# Patient Record
Sex: Male | Born: 1975 | Race: White | Hispanic: No | Marital: Single | Smoking: Never smoker
Health system: Southern US, Community
[De-identification: ages and names within clinical notes are randomized; demographics above are authoritative.]

## PROBLEM LIST (undated history)

## (undated) DIAGNOSIS — I10 Essential (primary) hypertension: Secondary | ICD-10-CM

## (undated) DIAGNOSIS — I456 Pre-excitation syndrome: Secondary | ICD-10-CM

## (undated) HISTORY — DX: Essential (primary) hypertension: I10

## (undated) HISTORY — DX: Pre-excitation syndrome: I45.6

---

## 2005-02-09 ENCOUNTER — Emergency Department: Payer: Self-pay | Admitting: Emergency Medicine

## 2007-07-18 ENCOUNTER — Emergency Department: Payer: Self-pay | Admitting: Emergency Medicine

## 2010-05-31 ENCOUNTER — Encounter: Payer: Self-pay | Admitting: Physician Assistant

## 2010-06-24 ENCOUNTER — Encounter: Payer: Self-pay | Admitting: Physician Assistant

## 2011-02-19 ENCOUNTER — Ambulatory Visit: Payer: Self-pay | Admitting: General Practice

## 2011-03-13 ENCOUNTER — Ambulatory Visit: Payer: Self-pay | Admitting: Gastroenterology

## 2012-01-28 LAB — URINALYSIS, COMPLETE
Bacteria: NONE SEEN
Bilirubin,UR: NEGATIVE
Blood: NEGATIVE
Nitrite: NEGATIVE
Ph: 7 (ref 4.5–8.0)
Protein: NEGATIVE

## 2012-01-28 LAB — COMPREHENSIVE METABOLIC PANEL
BUN: 8 mg/dL (ref 7–18)
Bilirubin,Total: 0.7 mg/dL (ref 0.2–1.0)
Calcium, Total: 8.8 mg/dL (ref 8.5–10.1)
Chloride: 101 mmol/L (ref 98–107)
Co2: 28 mmol/L (ref 21–32)
Creatinine: 0.87 mg/dL (ref 0.60–1.30)
Glucose: 101 mg/dL — ABNORMAL HIGH (ref 65–99)
Osmolality: 269 (ref 275–301)
Potassium: 3.7 mmol/L (ref 3.5–5.1)
SGOT(AST): 18 U/L (ref 15–37)
Total Protein: 8.2 g/dL (ref 6.4–8.2)

## 2012-01-28 LAB — CK TOTAL AND CKMB (NOT AT ARMC)
CK, Total: 49 U/L (ref 35–232)
CK-MB: <0.5 ng/mL — ABNORMAL LOW (ref 0.5–3.6)

## 2012-01-28 LAB — LIPASE, BLOOD: Lipase: 130 U/L (ref 73–393)

## 2012-01-29 ENCOUNTER — Observation Stay: Payer: Self-pay | Admitting: Internal Medicine

## 2012-01-29 LAB — TROPONIN I: Troponin-I: <0.02 ng/mL

## 2012-02-03 LAB — CULTURE, BLOOD (SINGLE)

## 2012-02-25 ENCOUNTER — Ambulatory Visit: Payer: Self-pay | Admitting: Cardiovascular Disease

## 2012-03-10 HISTORY — PX: COLONOSCOPY: SHX174

## 2012-09-25 ENCOUNTER — Emergency Department: Payer: Self-pay | Admitting: Emergency Medicine

## 2012-09-25 LAB — COMPREHENSIVE METABOLIC PANEL
BUN: 10 mg/dL (ref 7–18)
Bilirubin,Total: 0.5 mg/dL (ref 0.2–1.0)
Calcium, Total: 8.1 mg/dL — ABNORMAL LOW (ref 8.5–10.1)
Co2: 26 mmol/L (ref 21–32)
Creatinine: 0.82 mg/dL (ref 0.60–1.30)
EGFR (African American): >60
EGFR (Non-African Amer.): >60
Osmolality: 280 (ref 275–301)
Potassium: 3.5 mmol/L (ref 3.5–5.1)
SGOT(AST): 18 U/L (ref 15–37)
Sodium: 141 mmol/L (ref 136–145)
Total Protein: 7.4 g/dL (ref 6.4–8.2)

## 2012-09-25 LAB — URINALYSIS, COMPLETE
Bacteria: NONE SEEN
Bilirubin,UR: NEGATIVE
Blood: NEGATIVE
Ketone: NEGATIVE
Leukocyte Esterase: NEGATIVE
Nitrite: NEGATIVE
Ph: 7 (ref 4.5–8.0)
Protein: NEGATIVE
RBC,UR: <1 /HPF (ref 0–5)
Specific Gravity: 1.005 (ref 1.003–1.030)
Squamous Epithelial: NONE SEEN
WBC UR: <1 /HPF (ref 0–5)

## 2012-09-25 LAB — CBC
MCH: 30.1 pg (ref 26.0–34.0)
MCHC: 35.1 g/dL (ref 32.0–36.0)
MCV: 86 fL (ref 80–100)
RBC: 4.9 10*6/uL (ref 4.40–5.90)
RDW: 12.7 % (ref 11.5–14.5)

## 2012-09-25 LAB — LIPASE, BLOOD: Lipase: 147 U/L (ref 73–393)

## 2012-09-29 DIAGNOSIS — N138 Other obstructive and reflux uropathy: Secondary | ICD-10-CM | POA: Insufficient documentation

## 2012-11-13 ENCOUNTER — Observation Stay: Payer: Self-pay | Admitting: Internal Medicine

## 2012-11-13 LAB — BASIC METABOLIC PANEL
Anion Gap: 8 (ref 7–16)
Calcium, Total: 8.6 mg/dL (ref 8.5–10.1)
Co2: 24 mmol/L (ref 21–32)
Creatinine: 0.9 mg/dL (ref 0.60–1.30)
EGFR (African American): >60
EGFR (Non-African Amer.): >60
Glucose: 106 mg/dL — ABNORMAL HIGH (ref 65–99)
Osmolality: 277 (ref 275–301)

## 2012-11-13 LAB — CBC
HCT: 44.2 % (ref 40.0–52.0)
HGB: 14.8 g/dL (ref 13.0–18.0)
MCH: 29.2 pg (ref 26.0–34.0)
MCHC: 33.5 g/dL (ref 32.0–36.0)
MCV: 87 fL (ref 80–100)
RBC: 5.07 10*6/uL (ref 4.40–5.90)
RDW: 13.3 % (ref 11.5–14.5)
WBC: 12.5 10*3/uL — ABNORMAL HIGH (ref 3.8–10.6)

## 2012-11-13 LAB — HEPATIC FUNCTION PANEL A (ARMC)
Albumin: 4.2 g/dL (ref 3.4–5.0)
Bilirubin, Direct: 0.1 mg/dL (ref 0.00–0.20)
Bilirubin,Total: 0.9 mg/dL (ref 0.2–1.0)
Total Protein: 7.8 g/dL (ref 6.4–8.2)

## 2012-11-13 LAB — CK TOTAL AND CKMB (NOT AT ARMC)
CK, Total: 78 U/L (ref 35–232)
CK-MB: <0.5 ng/mL — ABNORMAL LOW (ref 0.5–3.6)

## 2012-11-13 LAB — TROPONIN I: Troponin-I: <0.02 ng/mL

## 2012-11-14 LAB — URINALYSIS, COMPLETE
Glucose,UR: NEGATIVE mg/dL (ref 0–75)
Ketone: NEGATIVE
Leukocyte Esterase: NEGATIVE
Ph: 7 (ref 4.5–8.0)
RBC,UR: <1 /HPF (ref 0–5)
Specific Gravity: 1.006 (ref 1.003–1.030)
Squamous Epithelial: NONE SEEN

## 2012-11-14 LAB — CBC WITH DIFFERENTIAL/PLATELET
Basophil #: 0 10*3/uL (ref 0.0–0.1)
Basophil %: 0.3 %
HGB: 13.2 g/dL (ref 13.0–18.0)
MCH: 30.3 pg (ref 26.0–34.0)
MCHC: 35.1 g/dL (ref 32.0–36.0)
MCV: 86 fL (ref 80–100)
Monocyte #: 0.4 x10 3/mm (ref 0.2–1.0)
Monocyte %: 4.8 %
Platelet: 156 10*3/uL (ref 150–440)
RBC: 4.35 10*6/uL — ABNORMAL LOW (ref 4.40–5.90)
RDW: 13.1 % (ref 11.5–14.5)
WBC: 7.8 10*3/uL (ref 3.8–10.6)

## 2012-11-14 LAB — TROPONIN I
Troponin-I: <0.02 ng/mL
Troponin-I: <0.02 ng/mL

## 2013-04-01 ENCOUNTER — Encounter: Payer: Self-pay | Admitting: *Deleted

## 2013-04-28 ENCOUNTER — Encounter: Payer: Self-pay | Admitting: General Surgery

## 2013-04-28 ENCOUNTER — Ambulatory Visit (INDEPENDENT_AMBULATORY_CARE_PROVIDER_SITE_OTHER): Payer: No Typology Code available for payment source | Admitting: General Surgery

## 2013-04-28 VITALS — BP 136/78 | HR 70 | Resp 14 | Ht 69.0 in | Wt 171.0 lb

## 2013-04-28 DIAGNOSIS — R1031 Right lower quadrant pain: Secondary | ICD-10-CM

## 2013-04-28 DIAGNOSIS — G8929 Other chronic pain: Secondary | ICD-10-CM | POA: Insufficient documentation

## 2013-04-28 NOTE — Patient Instructions (Addendum)
Patient to have another CT scan.  Patient has been scheduled for a CT abdomen/pelvis with contrast at [facility] for 05-01-13 at 1:30 pm (arrive 1:15 pm). Prep: no solids 4 hours prior but patient may have clear liquids up until exam time, pick up prep kit, and take medication list. Patient verbalizes understanding.

## 2013-04-28 NOTE — Progress Notes (Signed)
Patient ID: Bryan Coleman, male   DOB: July 31, 1976, 37 y.o.   MRN: [id number]  Chief Complaint  Patient presents with  . Other    hernia    HPI Bryan Coleman is a 37 y.o. male here today for an right inguinal hernia evaluation.Patient states he noticed it June 2014.Patient states he is having constant pain, with sharp exacerbations at times  HPI  Past Medical History  Diagnosis Date  . WPW (Wolff-Parkinson-White syndrome)   . Hypertension     Past Surgical History  Procedure Laterality Date  . Colonoscopy  03-10-12    Family History  Problem Relation Age of Onset  . Anemia Father     Social History History  Substance Use Topics  . Smoking status: Never Smoker   . Smokeless tobacco: Never Used  . Alcohol Use: No    Allergies  Allergen Reactions  . Robitussin (Alcohol Free) (Guaifenesin) Rash  . Tape Rash    blister    Current Outpatient Prescriptions  Medication Sig Dispense Refill  . metoprolol tartrate (LOPRESSOR) 25 MG tablet Take 25 mg by mouth daily.      . ondansetron (ZOFRAN) 8 MG tablet Take by mouth every 8 (eight) hours as needed for nausea.       No current facility-administered medications for this visit.    Review of Systems Review of Systems  Constitutional: Negative.   Respiratory: Negative.   Cardiovascular: Negative.     Blood pressure 136/78, pulse 70, resp. rate 14, height 5\' 9"  (1.753 m), weight 171 lb (77.565 kg).  Physical Exam Physical Exam  Constitutional: He is oriented to person, place, and time. He appears well-developed.  Eyes: Conjunctivae are normal. No scleral icterus.  Neck: Neck supple.  Cardiovascular: Normal rate, regular rhythm and normal heart sounds.   Pulmonary/Chest: Breath sounds normal.  Abdominal: Soft. Bowel sounds are normal. There is no hepatomegaly. There is tenderness (tenderness most marked in rlq area, no guarding or rebound) in the right lower quadrant, suprapubic area and left lower quadrant. No hernia.   Lymphadenopathy:    He has no cervical adenopathy.  Neurological: He is alert and oriented to person, place, and time.  Skin: Skin is warm and dry.    Data Reviewed Pt reports he had a CT scan done more than 1 mo ago-says it was ok.  Assessment No finding of hernia in right roin. His pain is more in rlq and suprapubic area.     Plan   Discussed above with pt. He is agreeable to a f/u scan in view of increased pain Patient to have another CT scan.    This patient has been scheduled for a CT abdomen/pelvis with contrast at [facility] for 05-01-13 at 1:30 pm (arrive 1:15 pm). Prep: no solids 4 hours prior but patient may have clear liquids up until exam time, pick up prep kit, and take medication list. Patient verbalizes understanding.   SANKAR,SEEPLAPUTHUR G 04/28/2013, 7:33 PM

## 2013-04-29 ENCOUNTER — Ambulatory Visit: Payer: Self-pay | Admitting: General Surgery

## 2013-05-04 ENCOUNTER — Telehealth: Payer: Self-pay | Admitting: *Deleted

## 2013-05-04 NOTE — Telephone Encounter (Signed)
Message copied by Nicholes Mango on Mon May 04, 2013  2:30 PM ------      Message from: Kieth Brightly      Created: Fri May 01, 2013  7:54 AM       CT reviewed. Would like pt to return for discussion. ------

## 2013-05-04 NOTE — Telephone Encounter (Signed)
Patient notified as instructed. He will return to the office for discussion on 05-05-13 at 11:30 am.

## 2013-05-05 ENCOUNTER — Encounter: Payer: Self-pay | Admitting: General Surgery

## 2013-05-05 ENCOUNTER — Ambulatory Visit (INDEPENDENT_AMBULATORY_CARE_PROVIDER_SITE_OTHER): Payer: No Typology Code available for payment source | Admitting: General Surgery

## 2013-05-05 VITALS — BP 124/72 | HR 76 | Resp 12 | Ht 69.0 in | Wt 172.0 lb

## 2013-05-05 DIAGNOSIS — G8929 Other chronic pain: Secondary | ICD-10-CM

## 2013-05-05 DIAGNOSIS — R1031 Right lower quadrant pain: Secondary | ICD-10-CM

## 2013-05-05 MED ORDER — CIPROFLOXACIN HCL 500 MG PO TABS: 500.0000 mg | ORAL_TABLET | Freq: Two times a day (BID) | ORAL | Status: DC

## 2013-05-05 MED ORDER — METRONIDAZOLE 500 MG PO TABS: 500.0000 mg | ORAL_TABLET | Freq: Three times a day (TID) | ORAL | Status: DC

## 2013-05-05 NOTE — Patient Instructions (Addendum)
The patient is aware to call back for any questions or concerns. Antibiotic therapy-1 week course  Patient has been scheduled for an appointment to see Owens Shark at [facility] for 05-19-13 at 12:30 pm. He has been placed on a cancellation list per his request.

## 2013-05-05 NOTE — Progress Notes (Signed)
Patient ID: Bryan Coleman, male   DOB: 10-Nov-1975, 37 y.o.   MRN: [id number]  Chief Complaint  Patient presents with  . Follow-up    SCAN    HPI Bryan Coleman is a 37 y.o. male.  Patient here today to review most recent CT scan. States he is still having abdominal pain in the right lower abdomen. No new issues other than constipation. HPI  Past Medical History  Diagnosis Date  . WPW (Wolff-Parkinson-White syndrome)   . Hypertension     Past Surgical History  Procedure Laterality Date  . Colonoscopy  03-10-12    Dr Bluford Kaufmann    Family History  Problem Relation Age of Onset  . Anemia Father     Social History History  Substance Use Topics  . Smoking status: Never Smoker   . Smokeless tobacco: Never Used  . Alcohol Use: No    Allergies  Allergen Reactions  . Robitussin (Alcohol Free) (Guaifenesin) Rash  . Tape Rash    blister    Current Outpatient Prescriptions  Medication Sig Dispense Refill  . ciprofloxacin (CIPRO) 500 MG tablet Take 1 tablet (500 mg total) by mouth 2 (two) times daily.  14 tablet  0  . metoprolol tartrate (LOPRESSOR) 25 MG tablet Take 25 mg by mouth daily.      . metroNIDAZOLE (FLAGYL) 500 MG tablet Take 1 tablet (500 mg total) by mouth 3 (three) times daily.  21 tablet  0  . ondansetron (ZOFRAN) 8 MG tablet Take by mouth every 8 (eight) hours as needed for nausea.       No current facility-administered medications for this visit.    Review of Systems Review of Systems  Constitutional: Negative.   Respiratory: Negative.   Cardiovascular: Negative.   Gastrointestinal: Positive for constipation.    Blood pressure 124/72, pulse 76, resp. rate 12, height 5\' 9"  (1.753 m), weight 172 lb (78.019 kg).  Physical Exam Physical Exam  Constitutional: He is oriented to person, place, and time. He appears well-developed and well-nourished.  Abdominal: Soft. There is tenderness in the right lower quadrant. No hernia. Hernia confirmed negative in the right  inguinal area and confirmed negative in the left inguinal area.  Neurological: He is alert and oriented to person, place, and time.  Skin: Skin is warm and dry.    Data Reviewed CT abdomen/pelvis reviewed. Only findings is mild thickening of sigmoid colon. This finding was noted also in CT done January 2014.   Assessment    Unchanged. Pain in abdomen may be form colitis in sigmoid area. Reasonable to try a course of antibiotics. Pt had seen Dr. Bluford Kaufmann from GI last yr and reportedly had colonoscopy.     Plan    Refer to Dr Bluford Kaufmann.  Trial treatment with antibiotics for infectious process.  No surgery indicated at this time.    Patient has been scheduled for an appointment to see Owens Shark at [facility] for 05-19-13 at 12:30 pm. He has been placed on a cancellation list per his request.    Kieth Brightly 05/05/2013, 1:27 PM

## 2013-06-01 IMAGING — CR DG CHEST 2V
1 series · 2 of 2 positions shown · non-contrast
Comparison: none

REASON FOR EXAM: SOB
COMMENTS:

[Series 1: x chest ap · 0.14mm/px · 2 of 2 slices shown]
[im 1/2]
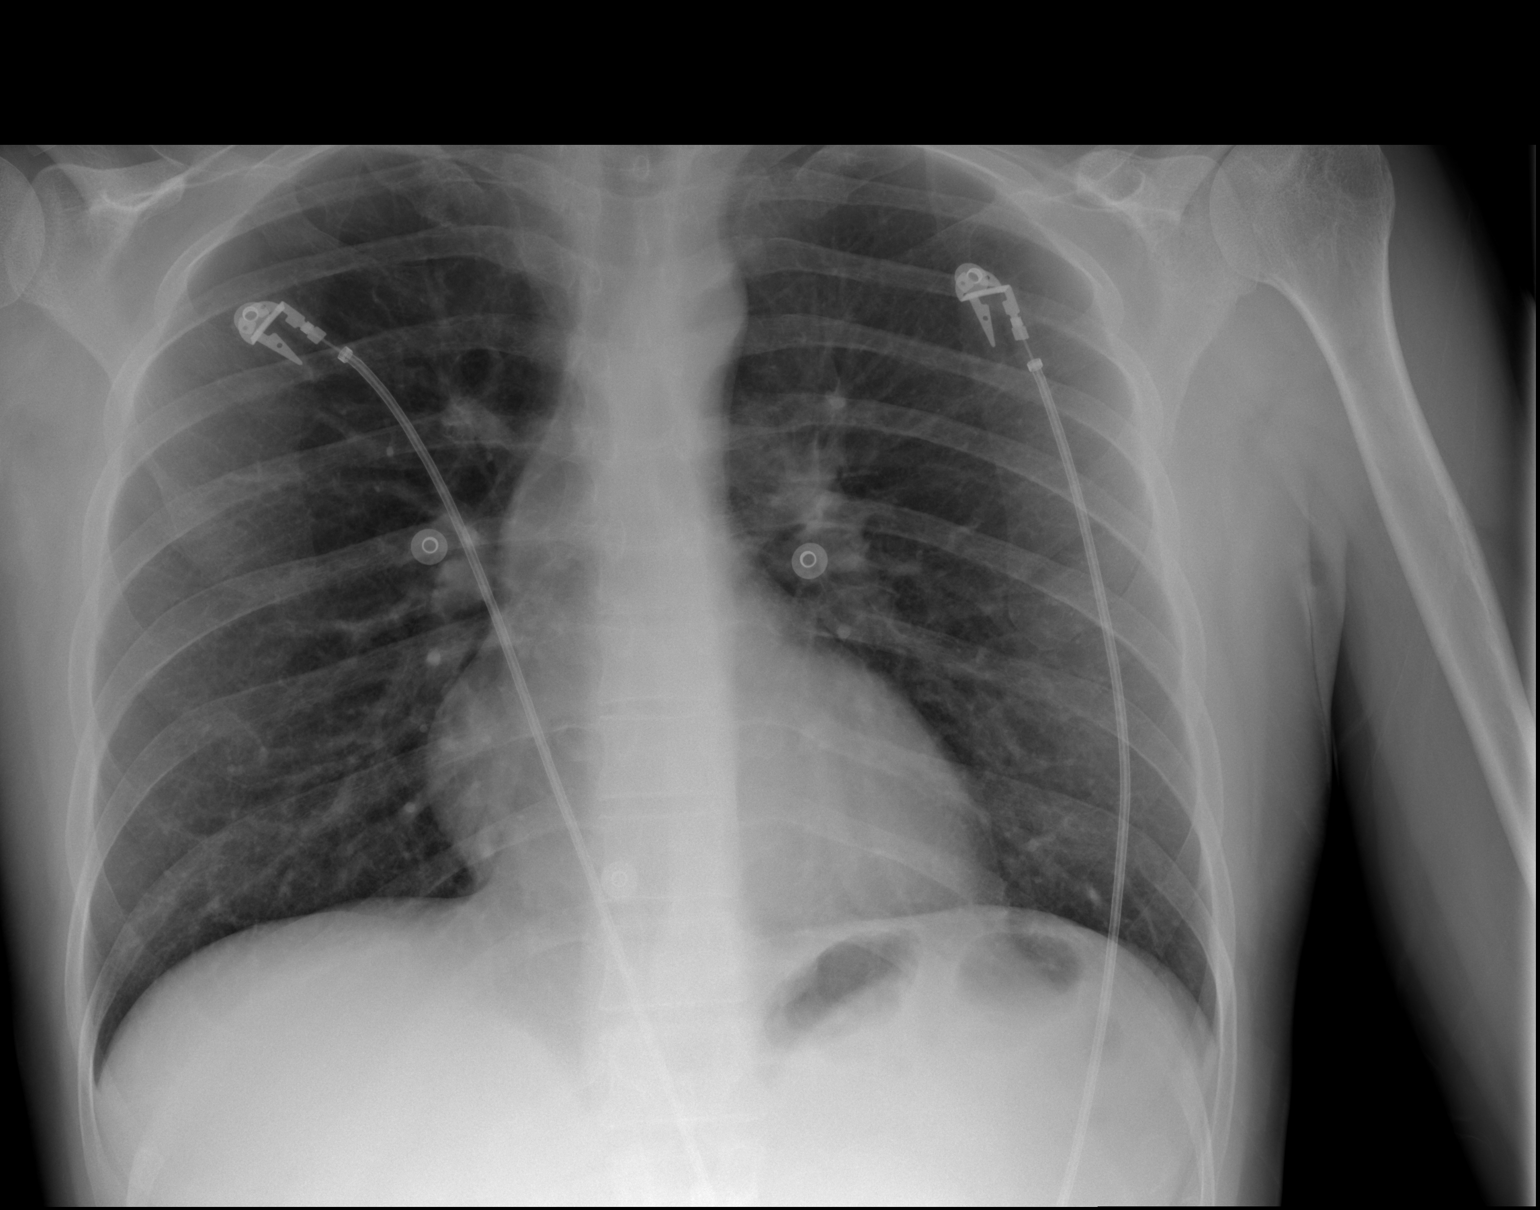
[im 2/2]
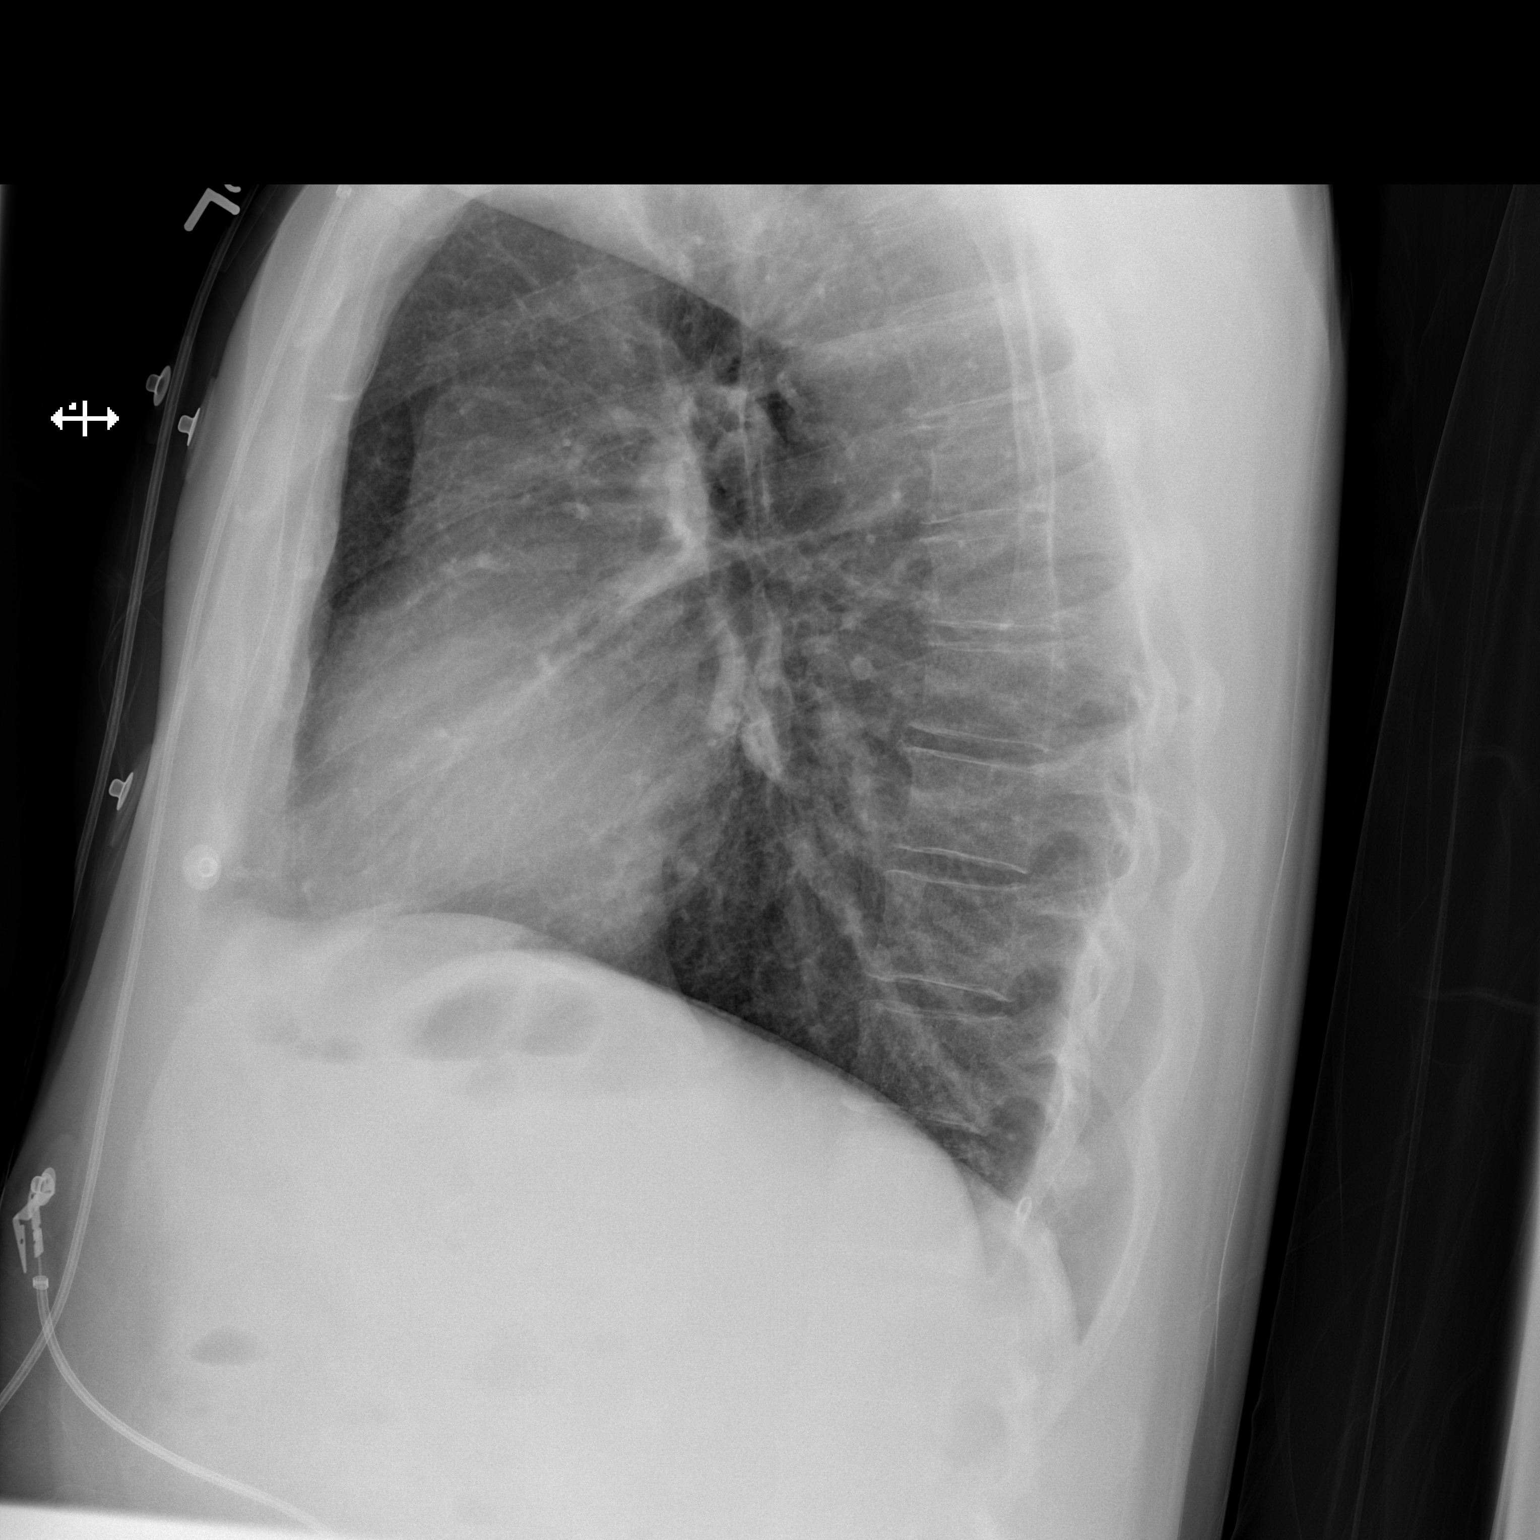

[2 of 2 positions shown; findings below may reference images not displayed]

PROCEDURE:     DXR - DXR CHEST PA (OR AP) AND LATERAL  - November 13, 2012  [DATE]

RESULT:     There are cardiac monitoring electrodes present. The lungs are
clear. The heart and pulmonary vessels are normal. The bony and mediastinal
structures are unremarkable. There is no effusion. There is no pneumothorax
or evidence of congestive failure.
IMPRESSION: No acute cardiopulmonary disease.

[REDACTED]

## 2013-10-28 ENCOUNTER — Emergency Department: Payer: Self-pay | Admitting: Emergency Medicine

## 2013-10-28 LAB — COMPREHENSIVE METABOLIC PANEL
ALBUMIN: 4.1 g/dL (ref 3.4–5.0)
Alkaline Phosphatase: 80 U/L
Anion Gap: 3 — ABNORMAL LOW (ref 7–16)
BILIRUBIN TOTAL: 0.6 mg/dL (ref 0.2–1.0)
BUN: 11 mg/dL (ref 7–18)
CALCIUM: 9 mg/dL (ref 8.5–10.1)
CREATININE: 0.83 mg/dL (ref 0.60–1.30)
Chloride: 105 mmol/L (ref 98–107)
Co2: 30 mmol/L (ref 21–32)
EGFR (Non-African Amer.): >60
Glucose: 85 mg/dL (ref 65–99)
Osmolality: 274 (ref 275–301)
Potassium: 3.7 mmol/L (ref 3.5–5.1)
SGOT(AST): 24 U/L (ref 15–37)
SGPT (ALT): 26 U/L (ref 12–78)
Sodium: 138 mmol/L (ref 136–145)
Total Protein: 7.9 g/dL (ref 6.4–8.2)

## 2013-10-28 LAB — CBC
HCT: 44.1 % (ref 40.0–52.0)
HGB: 15.4 g/dL (ref 13.0–18.0)
MCH: 30.4 pg (ref 26.0–34.0)
MCHC: 35 g/dL (ref 32.0–36.0)
MCV: 87 fL (ref 80–100)
Platelet: 211 10*3/uL (ref 150–440)
RBC: 5.07 10*6/uL (ref 4.40–5.90)
RDW: 13.4 % (ref 11.5–14.5)
WBC: 6 10*3/uL (ref 3.8–10.6)

## 2013-10-28 LAB — LIPASE, BLOOD: LIPASE: 190 U/L (ref 73–393)

## 2014-05-10 ENCOUNTER — Emergency Department: Payer: Self-pay | Admitting: Emergency Medicine

## 2014-05-10 LAB — LIPASE, BLOOD: Lipase: 213 U/L (ref 73–393)

## 2014-05-10 LAB — CBC
HCT: 45.3 % (ref 40.0–52.0)
HGB: 15.2 g/dL (ref 13.0–18.0)
MCH: 29.7 pg (ref 26.0–34.0)
MCHC: 33.6 g/dL (ref 32.0–36.0)
MCV: 88 fL (ref 80–100)
Platelet: 214 10*3/uL (ref 150–440)
RBC: 5.13 10*6/uL (ref 4.40–5.90)
RDW: 13.1 % (ref 11.5–14.5)
WBC: 6.3 10*3/uL (ref 3.8–10.6)

## 2014-05-10 LAB — URINALYSIS, COMPLETE
BILIRUBIN, UR: NEGATIVE
BLOOD: NEGATIVE
Bacteria: NONE SEEN
GLUCOSE, UR: NEGATIVE mg/dL (ref 0–75)
Ketone: NEGATIVE
LEUKOCYTE ESTERASE: NEGATIVE
NITRITE: NEGATIVE
PH: 6 (ref 4.5–8.0)
Protein: NEGATIVE
RBC,UR: NONE SEEN /HPF (ref 0–5)
SQUAMOUS EPITHELIAL: NONE SEEN
Specific Gravity: 1.005 (ref 1.003–1.030)
WBC UR: <1 /HPF (ref 0–5)

## 2014-05-10 LAB — HEMOGLOBIN: HGB: 15.5 g/dL (ref 13.0–18.0)

## 2014-05-10 LAB — COMPREHENSIVE METABOLIC PANEL
ANION GAP: 5 — AB (ref 7–16)
AST: 13 U/L — AB (ref 15–37)
Albumin: 4.1 g/dL (ref 3.4–5.0)
Alkaline Phosphatase: 74 U/L
BUN: 14 mg/dL (ref 7–18)
Bilirubin,Total: 0.3 mg/dL (ref 0.2–1.0)
Calcium, Total: 9 mg/dL (ref 8.5–10.1)
Chloride: 106 mmol/L (ref 98–107)
Co2: 29 mmol/L (ref 21–32)
Creatinine: 1.03 mg/dL (ref 0.60–1.30)
Glucose: 96 mg/dL (ref 65–99)
Osmolality: 280 (ref 275–301)
Potassium: 3.7 mmol/L (ref 3.5–5.1)
SGPT (ALT): 22 U/L
SODIUM: 140 mmol/L (ref 136–145)
Total Protein: 7.7 g/dL (ref 6.4–8.2)

## 2014-05-10 LAB — PROTIME-INR
INR: 0.9
PROTHROMBIN TIME: 12.1 s (ref 11.5–14.7)

## 2014-12-28 DIAGNOSIS — M222X2 Patellofemoral disorders, left knee: Secondary | ICD-10-CM | POA: Insufficient documentation

## 2015-01-14 NOTE — H&P (Signed)
PATIENT NAME:  Bryan Coleman, Bryan Coleman MR#:  [id number] DATE OF BIRTH:  09/17/1976  DATE OF ADMISSION:  11/13/2012  PRIMARY CARE PHYSICIAN: Evalee Muttonejan sie REFERRING PHYSICIAN:  Glennie IsleSheryl Gottlieb, MD  CHIEF COMPLAINT: Near syncope and chest pain today.   HISTORY OF PRESENT ILLNESS: This is a 39 year old Caucasian male with a history of WPW syndrome, who presented to the ED with above chief complaint. The patient is alert, awake, oriented. According to the patient, he feels dizzy and weak and almost passed out today. In addition, the patient has chest pain today, which is in the substernal area 3 out of 10, dull, no radiation, intermittent.  Actually the patient has had chest pain intermittently 1 month. He said he has poor appetite and for the past 1 month did not eat well. The patient vomited once in the ED, but the patient denies any fever or chills. No headache. No abdominal pain. No melena. No bloody stool. No diarrhea. The patient was treated with normal saline bolus in the ED.  PAST MEDICAL HISTORY:  WPW syndrome.   PAST SURGICAL HISTORY: No.   SOCIAL HISTORY: No smoking or drinking or illicit drugs.   FAMILY HISTORY: Both parents are healthy.   HOME MEDICATIONS: Lopressor 25 mg p.o. b.i.d.   ALLERGIES: ROBATUSSIN.   REVIEW OF SYSTEMS: CONSTITUTIONAL: The patient denies any fever or chills. No headache, but has dizziness and weakness.  EYES: No double vision, blurred vision.  ENT: No postnasal drip, slurred speech or dysphagia.  CARDIOVASCULAR: Positive for chest pain, but no palpitations, orthopnea, no nocturnal dyspnea. No leg edema.  PULMONARY: No cough, sputum, shortness of breath, or hemoptysis.  GASTROINTESTINAL: No abdominal pain, but has nausea and vomiting. No diarrhea, melena or bloody stool.  GENITOURINARY: No dysuria, hematuria or incontinence.  SKIN: No rash or jaundice.  ENDOCRINE: No polyuria, polydipsia, heat or cold intolerance.  HEMATOLOGY: No easy bruising or bleeding.   SKIN: No rash or jaundice.  NEUROLOGY: Positive for near syncope, but no loss of consciousness or seizure or incontinence.  PSYCHIATRIC: Denies any depression or anxiety.   PHYSICAL EXAMINATION: VITAL SIGNS: Temperature 97.6, blood pressure 122/80, pulse 102, respirations 18, oxygen saturation 99% on room air.  GENERAL: The patient is alert, awake, oriented, in no acute distress.  HEENT: Pupils round, equal, reactive to light and accommodation. Moist oral mucosa. Clear oropharynx.  NECK: Supple. No JVD or carotid bruits. No lymphadenopathy no thyromegaly.  CARDIOVASCULAR: S1, S2 regular rate, rhythm. No murmurs or gallops.  PULMONARY: Bilateral air entry. No wheezing or rales. No use of accessory muscles to breathe.  ABDOMEN: Soft. No distention or tenderness. No organomegaly. Bowel sounds present.  EXTREMITIES: No edema, clubbing or cyanosis. No calf tenderness. Strong bilateral pedal pulses.  SKIN: No rash or jaundice.  NEUROLOGIC: Alert and oriented x3. No focal deficit. Power 5/5. Sensation intact.    LABORATORY DATA: Troponin less than 0.02. Lipase 166, bilirubin 0.9, CK 78, CK-MB less than 0.5, D-dimer 0.23, WBC 12.5, hemoglobin 14.8, platelets 218, glucose 106, BUN 11, creatinine 0.9. Electrolytes normal.   EKG showed normal sinus rhythm at 72 BPM.   IMPRESSION: 1.  Near syncope, unclear etiology, possibly due to mild dehydration and due to poor oral intake.  2.  Chest pain.  Atypical.   3.  Tachycardia.  4.  Leukocytosis, unclear etiology.  5.  History of Wolff-Parkinson-White syndrome.   PLAN OF TREATMENT: 1.  The patient will be placed for observation. We will continue telemonitor. Give IV fluid support,  follow up troponin level.  2.  We will hold Lopressor due to low blood pressure at 95/70.  3.  Gastrointestinal and deep vein thrombosis prophylaxis.  4.  Discussed the patient's situation and plan of treatment with the patient and the patient's father.   TIME SPENT:  About 58 minutes    ____________________________ Shaune Pollack, MD qc:cc D: 11/13/2012 22:03:59 ET T: 11/13/2012 22:32:18 ET JOB#: [id number]  cc: Shaune Pollack, MD, <Dictator> Shaune Pollack MD ELECTRONICALLY SIGNED 11/14/2012 14:39

## 2015-01-16 NOTE — Consult Note (Signed)
PATIENT NAME:  Bryan Coleman, Bryan Coleman MR#:  [id number] DATE OF BIRTH:  Apr 05, 1976  DATE OF CONSULTATION:  01/29/2012  REFERRING PHYSICIAN:   CONSULTING PHYSICIAN:  Laurier NancyShaukat A. 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 He says he had a sore throat yesterday and had upper respiratory tract infection a month ago and was treated with Zithromax. Yesterday again had some sore throat and then tightness in the chest, lasted for more than 20 minutes. EKG was done which showed nonspecific ST-T changes thus he was referred to the Emergency Room for admission. He denies any chest pain right now. He had a CT of the chest and abdomen which was unremarkable.   PAST MEDICAL HISTORY: Essentially unremarkable. No history of diabetes, hypertension, hypercholesterolemia.   SOCIAL HISTORY: Unremarkable. No history of EtOH abuse or smoking or any drug use.   FAMILY HISTORY: Unremarkable.   PHYSICAL EXAMINATION:  GENERAL: He is alert, oriented x3, in no acute distress.   VITALS: Blood pressure 115/77, respirations 18, pulse 67, temperature 97.9, saturation 96.   NECK: No JVD.   LUNGS: Clear.   HEART: Regular rate, rhythm. Normal S1, S2. No audible murmur.   ABDOMEN: Soft, nontender, positive bowel sounds.   EXTREMITIES: No pedal edema.   NEUROLOGIC: Appears to be intact.   LABORATORY, DIAGNOSTIC AND RADIOLOGICAL DATA: EKG shows normal sinus rhythm, within normal limits. Earlier EKG yesterday had sinus tachycardia with nonspecific ST-T changes. Two sets of cardiac enzymes are unremarkable. His troponins are 0.02 x2. Rest of the labs are also unremarkable except his sodium was mildly decreased 135 and glucose was mildly elevated 101.   ASSESSMENT AND PLAN: Atypical chest pain, most likely secondary to respiratory infection, viral versus bacterial versus musculoskeletal cause of chest pain. I agree with getting an  echocardiogram to further evaluate the wall motion and ejection fraction. He can be discharged after the echocardiogram with follow up in the office on Thursday at 10:00.   Thank you very much for referral.  ____________________________ Laurier NancyShaukat A. Leeta Grimme, MD sak:cms D: 01/29/2012 09:07:26 ET T: 01/29/2012 09:19:03 ET JOB#: [id number]  cc: Laurier NancyShaukat A. Rayona Sardinha, MD, <Dictator> Laurier NancySHAUKAT A Fredrick Geoghegan MD ELECTRONICALLY SIGNED 01/29/2012 16:45

## 2015-01-16 NOTE — Discharge Summary (Signed)
PATIENT NAME:  Bryan Coleman, Bryan Coleman MR#:  [id number] DATE OF BIRTH:  [birth year]  DATE OF ADMISSION:  01/29/2012 DATE OF DISCHARGE:  01/29/2012  PRIMARY CARE PHYSICIAN: None.   DISCHARGE DIAGNOSES: 1. Chest pain, likely noncardiac, could be muscular in nature or due to upper respiratory tract infection, possible viral pharyngitis. 2. 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  3. Possible acute upper respiratory infection, provided antibiotic to finish five-day course. 4. Fever likely due to pharyngitis.   SECONDARY DIAGNOSIS: None.   CONSULTATION: Cardiology, Dr. Adrian BlackwaterShaukat Khan   PROCEDURES/RADIOLOGY:  1. CT scan of the chest, abdomen, and pelvis with contrast May 6th was negative for any acute pathology.  2. 2-D echocardiogram on May 7th showed normal LV wall motion, normal LV size, EF more than 55%.   PERTINENT LABORATORY DATA: Urinalysis on admission was negative. Blood cultures x2 were negative on May 6th. Serum ASO titer was within normal limits.   HISTORY AND SHORT HOSPITAL COURSE: The patient is a 39 year old healthy male with above-mentioned medical problems who was admitted for chest pain. Cardiology consultation was obtained with Dr. Adrian BlackwaterShaukat Khan who recommended to rule him out with serial enzymes and was done. He had negative two sets of cardiac enzymes, had negative D-dimer, normal blood work, and negative two sets of blood cultures. His ASO titer was within normal limits. He had a normal echocardiogram. His chest pain was thought to be possibly muscular and/or from upper respiratory tract infection. He was running some low-grade fever for which he was prescribed Augmentin for possible pharyngitis. He was feeling much better, did not have any further chest pain, and was discharged home in stable condition.   On the day of discharge, his vital signs are as follows: Temperature 98.6,  heart rate 89 per minute, respirations 20 per minute, blood pressure 131/88 mmHg. He was saturating 97% on room air.   PERTINENT PHYSICAL EXAMINATION ON THE DATE OF DISCHARGE: CARDIOVASCULAR: S1, S2 normal. No murmurs, rubs, or gallop. LUNGS: Clear to auscultation bilaterally. No wheezes, rales, rhonchi, or crepitation. ABDOMEN: Soft, benign. NEUROLOGIC: Nonfocal examination. Throat appeared to be congested with minimal erythema. No exudate. All other physical examination remained at baseline.   DISCHARGE MEDICATIONS:  1. Augmentin 875 mg p.o. b.i.d. for five days.  2. Cepacol lozenges every four hours as needed.  3. Ibuprofen 400 mg p.o. every six hours as needed.   DISCHARGE DIET: Regular.   DISCHARGE ACTIVITY: As tolerated.   DISCHARGE INSTRUCTIONS AND FOLLOW-UP:  1. The patient was instructed to follow-up with his primary care physician (new PCP) in 1 to 2 weeks.  2. He will need follow-up with Dr. Adrian BlackwaterShaukat Khan from Cardiology on May 9th as scheduled at 10 a.m.   TOTAL TIME DISCHARGING THIS PATIENT: 45 minutes.  ____________________________ Ellamae SiaVipul S. Sherryll BurgerShah, MD vss:drc D: 02/01/2012 10:20:40 ET T: 02/04/2012 09:55:07 ET JOB#: [id number]  cc: Nazier Neyhart S. Sherryll BurgerShah, MD, <Dictator> Laurier NancyShaukat A. Khan, MD Ellamae SiaVIPUL S [facility] MD ELECTRONICALLY SIGNED 02/07/2012 9:00

## 2015-01-16 NOTE — H&P (Signed)
PATIENT NAME:  Bryan Coleman, [name] MR#:  [id number] DATE OF BIRTH:  08-26-1976  DATE OF ADMISSION:  01/29/2012  PRIMARY CARE PHYSICIAN: He has no primary care physician.   CHIEF COMPLAINT: Chest pain and abnormal EKG.   HISTORY OF PRESENT ILLNESS: Mr. Bryan Coleman is a 39 year old young Caucasian male with healthy past medical history. He reports that yesterday he had a sore throat. However, this morning when he woke up he reported midsternal chest pain located at the lower part of the sternum, described as a heaviness and  pressure-like feeling, severity was 3 on a scale of 10. After several hours the pain eased off to 1 on a scale of 10 at this time. 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 The patient went to an urgent care clinic. He had blood work and EKG and then was referred to the hospital for further evaluation.   REVIEW OF SYSTEMS: CONSTITUTIONAL: He had some fever and chills. No fatigue, no sweating. EYES: No blurring of vision. No double vision. ENT: No hearing impairment. He reports sore throat. No significant dysphagia. No epistaxis. No dizziness. CARDIOVASCULAR: Reports chest pain as above. No shortness of breath. No edema. No syncope. RESPIRATORY: No cough. No sputum production. No shortness of breath, but reports chest pain as above. GASTROINTESTINAL: No abdominal pain. No nausea. He had one episode of vomiting after receiving IV contrast during CT scan imaging. No diarrhea. GENITOURINARY: No dysuria. No frequency of urination. MUSCULOSKELETAL: No joint pain or swelling. No muscular pain or swelling. INTEGUMENT: No skin rash. No ulcers. NEUROLOGY: No focal weakness. No seizure activity. No headache. PSYCHIATRY: No anxiety. No depression. ENDOCRINE: No polyuria or polydipsia. No heat or cold intolerance.   PAST MEDICAL HISTORY: Healthy. No prior history of heart problems. No prior history of chest pain. No diabetes. No  hypertension.   FAMILY HISTORY: Negative for premature coronary artery disease, negative for hypertension or diabetes. Both parents are alive and healthy.   SOCIAL HABITS: Nonsmoker. No history of alcohol or drug abuse.   SOCIAL HISTORY: He is single. 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   ADMISSION MEDICATIONS: None.   ALLERGIES: Robitussin causes skin rash.   PHYSICAL EXAMINATION:  VITAL SIGNS: Blood pressure 124/77, respiratory rate 20, pulse 111, temperature 100.2, pulse oximetry 99% on room air.   GENERAL APPEARANCE: Young male sitting at the bedside in no acute distress, healthy looking.   HEAD: No pallor. No icterus. No cyanosis.   ENT: Hearing was normal. Nasal mucosa, lips, tongue were normal. Throat appears to be congested with erythema. No exudate.   HEART: Normal S1, S2. No S3, S4. No murmur. No gallop. No carotid bruits.   RESPIRATORY: Normal breathing pattern without use of accessory muscles. No rales. No wheezing.   ABDOMEN: Soft without tenderness. No hepatosplenomegaly. No masses. No hernias.   SKIN: No ulcers. No subcutaneous nodules.   MUSCULOSKELETAL: No joint swelling. No clubbing.   NEUROLOGIC: Cranial nerves II through XII were intact. No focal motor deficit.   PSYCHIATRY: The patient is alert and oriented times three. Mood and affect were normal.   LABORATORY, DIAGNOSTIC, AND RADIOLOGICAL DATA: CT scan of the chest, abdomen, and pelvis were negative. Serum glucose 101, BUN 8, creatinine 0.8, sodium 135, potassium 3.7. Liver function tests were normal. Total CPK 49. Troponin less than 0.02. CBC was done at an outside lab and showed a white  count 12,300, hemoglobin 15, hematocrit 42, platelet count 183. Differential showed 84% neutrophils, 6% lymphocytes, 9% monocytes. Rapid strep test was negative. EKG showed sinus tachycardia at rate of 106 per minute. ST-T wave abnormalities in the inferolateral leads. Similar findings of EKG changes on the EKG  done earlier with EMS and at the urgent care clinic. No old EKG to compare.   IMPRESSION:   1. Chest pain for evaluation. Etiology is unclear.  2. EKG abnormalities in the form of ST-T wave abnormalities in the inferolateral leads. We do not have an old EKG to compare. Question is raised whether the patient has the beginning of myocarditis in association with his sore throat.  3. Sore throat.   PLAN: The patient was admitted to the medical floor on telemetry. Follow up on cardiac enzymes. We will obtain echocardiogram. Consult cardiology. Empiric IV antibiotic using Rocephin 1 gram daily. Check ASO titer. Tylenol p.r.n. for his fever and sore throat.   TIME SPENT EVALUATING THIS PATIENT: More than 45 minutes.     ____________________________ Carney Corners. Rudene Re, MD amd:bjt D: 01/29/2012 01:10:40 ET T: 01/29/2012 10:01:42 ET JOB#: [id number]  cc: Carney Corners. Rudene Re, MD, <Dictator> Karolee Ohs Dala Dock MD ELECTRONICALLY SIGNED 01/30/2012 22:11

## 2015-09-06 ENCOUNTER — Other Ambulatory Visit: Payer: Self-pay | Admitting: Family

## 2015-09-06 ENCOUNTER — Ambulatory Visit
Admission: RE | Admit: 2015-09-06 | Discharge: 2015-09-06 | Disposition: A | Discharge status: Home / Self Care | Payer: Worker's Compensation | Source: Ambulatory Visit | Attending: Family | Admitting: Family

## 2015-09-06 DIAGNOSIS — R52 Pain, unspecified: Secondary | ICD-10-CM

## 2015-09-06 DIAGNOSIS — M79644 Pain in right finger(s): Secondary | ICD-10-CM | POA: Diagnosis not present

## 2016-10-17 ENCOUNTER — Encounter: Payer: Self-pay | Admitting: Physician Assistant

## 2016-10-17 ENCOUNTER — Encounter (INDEPENDENT_AMBULATORY_CARE_PROVIDER_SITE_OTHER): Payer: Self-pay

## 2016-10-17 ENCOUNTER — Ambulatory Visit: Payer: Self-pay | Admitting: Physician Assistant

## 2016-10-17 VITALS — BP 133/95 | HR 73 | Temp 98.2°F | Ht 69.0 in | Wt 169.0 lb

## 2016-10-17 DIAGNOSIS — K5792 Diverticulitis of intestine, part unspecified, without perforation or abscess without bleeding: Secondary | ICD-10-CM | POA: Insufficient documentation

## 2016-10-17 DIAGNOSIS — G43909 Migraine, unspecified, not intractable, without status migrainosus: Secondary | ICD-10-CM | POA: Insufficient documentation

## 2016-10-17 DIAGNOSIS — I456 Pre-excitation syndrome: Secondary | ICD-10-CM | POA: Insufficient documentation

## 2016-10-17 DIAGNOSIS — Z8669 Personal history of other diseases of the nervous system and sense organs: Secondary | ICD-10-CM | POA: Insufficient documentation

## 2016-10-17 DIAGNOSIS — Z0000 Encounter for general adult medical examination without abnormal findings: Secondary | ICD-10-CM

## 2016-10-17 NOTE — Progress Notes (Signed)
S: here for physical exam and biometric screening, no complaints today, pmhx as stated on chart, meds as stated on chart, allergies as stated on chart, social hx as stated on chart, reviewed all with patient, ros negative  O: vitals wnl, nad, ent wnl, neck supple no lymph, lungs c t a, cv rrr, no bruits noted at carotids, abd soft nontender bs normal all 4 quads  A: adult male with hx of migraines, htn, wpw, diverticulitis, and trace regurge in mitral, tricuspid, and pulmonic valves  P: biometric form filled out, labs for male exec panel, vit d, hep c and hiv screening

## 2016-10-18 LAB — CMP12+LP+TP+TSH+6AC+PSA+CBC…
A/G RATIO: 1.4 (ref 1.2–2.2)
ALT: 19 IU/L (ref 0–44)
AST: 15 IU/L (ref 0–40)
Albumin: 4.2 g/dL (ref 3.5–5.5)
Alkaline Phosphatase: 73 IU/L (ref 39–117)
BASOS: 1 %
BUN/Creatinine Ratio: 13 (ref 9–20)
BUN: 11 mg/dL (ref 6–24)
Basophils Absolute: 0 10*3/uL (ref 0.0–0.2)
Bilirubin Total: 0.4 mg/dL (ref 0.0–1.2)
CALCIUM: 9.1 mg/dL (ref 8.7–10.2)
CHOL/HDL RATIO: 3.9 ratio (ref 0.0–5.0)
Chloride: 101 mmol/L (ref 96–106)
Cholesterol, Total: 152 mg/dL (ref 100–199)
Creatinine, Ser: 0.83 mg/dL (ref 0.76–1.27)
EOS (ABSOLUTE): 0 10*3/uL (ref 0.0–0.4)
ESTIMATED CHD RISK: 0.7 times avg. (ref 0.0–1.0)
Eos: 1 %
Free Thyroxine Index: 1.6 (ref 1.2–4.9)
GFR, EST AFRICAN AMERICAN: 125 mL/min/{1.73_m2} (ref >59)
GFR, EST NON AFRICAN AMERICAN: 109 mL/min/{1.73_m2} (ref >59)
GGT: 16 IU/L (ref 0–65)
GLUCOSE: 94 mg/dL (ref 65–99)
Globulin, Total: 3.1 g/dL (ref 1.5–4.5)
HDL: 39 mg/dL — AB (ref >39)
HEMATOCRIT: 43.4 % (ref 37.5–51.0)
Hemoglobin: 15.1 g/dL (ref 13.0–17.7)
IMMATURE GRANS (ABS): 0 10*3/uL (ref 0.0–0.1)
Immature Granulocytes: 0 %
Iron: 116 ug/dL (ref 38–169)
LDH: 171 IU/L (ref 121–224)
LDL Calculated: 84 mg/dL (ref 0–99)
LYMPHS: 24 %
Lymphocytes Absolute: 1 10*3/uL (ref 0.7–3.1)
MCH: 30.1 pg (ref 26.6–33.0)
MCHC: 34.8 g/dL (ref 31.5–35.7)
MCV: 87 fL (ref 79–97)
MONOCYTES: 10 %
MONOS ABS: 0.4 10*3/uL (ref 0.1–0.9)
Neutrophils Absolute: 2.8 10*3/uL (ref 1.4–7.0)
Neutrophils: 64 %
PHOSPHORUS: 2.5 mg/dL (ref 2.5–4.5)
POTASSIUM: 4.5 mmol/L (ref 3.5–5.2)
Platelets: 218 10*3/uL (ref 150–379)
Prostate Specific Ag, Serum: 0.4 ng/mL (ref 0.0–4.0)
RBC: 5.02 x10E6/uL (ref 4.14–5.80)
RDW: 13.3 % (ref 12.3–15.4)
SODIUM: 142 mmol/L (ref 134–144)
T3 Uptake Ratio: 26 % (ref 24–39)
T4 TOTAL: 6.3 ug/dL (ref 4.5–12.0)
TOTAL PROTEIN: 7.3 g/dL (ref 6.0–8.5)
TSH: 4.58 u[IU]/mL — ABNORMAL HIGH (ref 0.450–4.500)
Triglycerides: 146 mg/dL (ref 0–149)
URIC ACID: 5.5 mg/dL (ref 3.7–8.6)
VLDL Cholesterol Cal: 29 mg/dL (ref 5–40)
WBC: 4.3 10*3/uL (ref 3.4–10.8)

## 2016-10-18 LAB — VITAMIN D 25 HYDROXY (VIT D DEFICIENCY, FRACTURES): VIT D 25 HYDROXY: 16.6 ng/mL — AB (ref 30.0–100.0)

## 2016-10-23 ENCOUNTER — Emergency Department: Payer: Managed Care, Other (non HMO)

## 2016-10-23 ENCOUNTER — Emergency Department
Admission: EM | Admit: 2016-10-23 | Discharge: 2016-10-23 | Disposition: A | Discharge status: Home / Self Care | Payer: Managed Care, Other (non HMO) | Attending: Emergency Medicine | Admitting: Emergency Medicine

## 2016-10-23 ENCOUNTER — Encounter: Payer: Self-pay | Admitting: Emergency Medicine

## 2016-10-23 DIAGNOSIS — I10 Essential (primary) hypertension: Secondary | ICD-10-CM | POA: Diagnosis not present

## 2016-10-23 DIAGNOSIS — R519 Headache, unspecified: Secondary | ICD-10-CM

## 2016-10-23 DIAGNOSIS — K219 Gastro-esophageal reflux disease without esophagitis: Secondary | ICD-10-CM | POA: Insufficient documentation

## 2016-10-23 DIAGNOSIS — Z79899 Other long term (current) drug therapy: Secondary | ICD-10-CM | POA: Insufficient documentation

## 2016-10-23 DIAGNOSIS — R51 Headache: Secondary | ICD-10-CM | POA: Insufficient documentation

## 2016-10-23 DIAGNOSIS — R197 Diarrhea, unspecified: Secondary | ICD-10-CM | POA: Diagnosis present

## 2016-10-23 LAB — CBC WITH DIFFERENTIAL/PLATELET
BASOS PCT: 1 %
Basophils Absolute: 0 10*3/uL (ref 0–0.1)
EOS ABS: 0 10*3/uL (ref 0–0.7)
EOS PCT: 0 %
HCT: 44.1 % (ref 40.0–52.0)
Hemoglobin: 15.9 g/dL (ref 13.0–18.0)
LYMPHS PCT: 9 %
Lymphs Abs: 0.7 10*3/uL — ABNORMAL LOW (ref 1.0–3.6)
MCH: 30.8 pg (ref 26.0–34.0)
MCHC: 35.9 g/dL (ref 32.0–36.0)
MCV: 85.7 fL (ref 80.0–100.0)
MONO ABS: 0.4 10*3/uL (ref 0.2–1.0)
Monocytes Relative: 5 %
Neutro Abs: 6.8 10*3/uL — ABNORMAL HIGH (ref 1.4–6.5)
Neutrophils Relative %: 85 %
PLATELETS: 216 10*3/uL (ref 150–440)
RBC: 5.14 MIL/uL (ref 4.40–5.90)
RDW: 13.3 % (ref 11.5–14.5)
WBC: 8 10*3/uL (ref 3.8–10.6)

## 2016-10-23 LAB — COMPREHENSIVE METABOLIC PANEL
ALBUMIN: 4.3 g/dL (ref 3.5–5.0)
ALT: 19 U/L (ref 17–63)
AST: 24 U/L (ref 15–41)
Alkaline Phosphatase: 63 U/L (ref 38–126)
Anion gap: 9 (ref 5–15)
BUN: 10 mg/dL (ref 6–20)
CHLORIDE: 105 mmol/L (ref 101–111)
CO2: 26 mmol/L (ref 22–32)
CREATININE: 0.82 mg/dL (ref 0.61–1.24)
Calcium: 8.9 mg/dL (ref 8.9–10.3)
GFR calc Af Amer: >60 mL/min (ref >60)
GLUCOSE: 103 mg/dL — AB (ref 65–99)
POTASSIUM: 3.8 mmol/L (ref 3.5–5.1)
SODIUM: 140 mmol/L (ref 135–145)
Total Bilirubin: 0.9 mg/dL (ref 0.3–1.2)
Total Protein: 7.7 g/dL (ref 6.5–8.1)

## 2016-10-23 LAB — LIPASE, BLOOD: LIPASE: 26 U/L (ref 11–51)

## 2016-10-23 MED ORDER — GI COCKTAIL ~~LOC~~
30.0000 mL | Freq: Once | ORAL | Status: AC
  Administered 2016-10-23: 30 mL via ORAL
  Filled 2016-10-23: qty 30

## 2016-10-23 MED ORDER — FAMOTIDINE 20 MG PO TABS
20.0000 mg | ORAL_TABLET | Freq: Once | ORAL | Status: AC
  Administered 2016-10-23: 20 mg via ORAL
  Filled 2016-10-23: qty 1

## 2016-10-23 MED ORDER — OMEPRAZOLE 20 MG PO CPDR: 20.0000 mg | DELAYED_RELEASE_CAPSULE | Freq: Every day | ORAL | 1 refills | Status: DC

## 2016-10-23 MED ORDER — METOCLOPRAMIDE HCL 5 MG/ML IJ SOLN
10.0000 mg | Freq: Once | INTRAMUSCULAR | Status: AC
  Administered 2016-10-23: 10 mg via INTRAVENOUS
  Filled 2016-10-23: qty 2

## 2016-10-23 MED ORDER — KETOROLAC TROMETHAMINE 30 MG/ML IJ SOLN
30.0000 mg | Freq: Once | INTRAMUSCULAR | Status: AC
  Administered 2016-10-23: 30 mg via INTRAVENOUS
  Filled 2016-10-23: qty 1

## 2016-10-23 MED ORDER — SODIUM CHLORIDE 0.9 % IV SOLN
Freq: Once | INTRAVENOUS | Status: AC
  Administered 2016-10-23: 10:00:00 via INTRAVENOUS

## 2016-10-23 NOTE — ED Provider Notes (Signed)
[facility] Emergency Department Provider Note        Time seen: ----------------------------------------- 9:36 AM on 10/23/2016 -----------------------------------------    I have reviewed the triage vital signs and the nursing notes.   HISTORY  Chief Complaint Migraine    HPI Bryan Coleman is a 41 y.o. male who presents to ER for migraine headaches. Patient states he had a migraine that started last night. He took Excedrin this morning with some relief. Pain is currently 2 out of 10. He also complains of food feeling like it gets stuck in his throat which causes pain and nausea. Patient states food eventually passes. He is concerned he may have a hiatal hernia. He also had diarrhea this morning and has had nausea. Pain is diffuse in his head.   Past Medical History:  Diagnosis Date  . Hypertension   . WPW (Wolff-Parkinson-White syndrome)     Patient Active Problem List   Diagnosis Date Noted  . Migraine 10/17/2016  . History of migraine 10/17/2016  . WPW (Wolff-Parkinson-White syndrome) 10/17/2016  . Diverticulitis 10/17/2016  . Patellofemoral stress syndrome of left knee 12/28/2014  . Abdominal pain, chronic, right lower quadrant 04/28/2013  . Benign localized hyperplasia of prostate with urinary obstruction 09/29/2012    Past Surgical History:  Procedure Laterality Date  . COLONOSCOPY  03-10-12   Dr Bluford Kaufmann    Allergies Camphor-phenol; Caramel; Chocolate flavor; Dextromethorphan-guaifenesin; Robitussin (alcohol free) [guaifenesin]; and Tape  Social History Social History  Substance Use Topics  . Smoking status: Never Smoker  . Smokeless tobacco: Never Used  . Alcohol use No    Review of Systems Constitutional: Negative for fever. Cardiovascular: Negative for chest pain. Respiratory: Negative for shortness of breath. Gastrointestinal: Negative for abdominal pain,Positive for nausea and diarrhea Musculoskeletal: Negative for back  pain. Skin: Negative for rash. Neurological: Positive for headache  10-point ROS otherwise negative.  ____________________________________________   PHYSICAL EXAM:  VITAL SIGNS: ED Triage Vitals  Enc Vitals Group     BP 10/23/16 0902 (!) 140/99     Pulse Rate 10/23/16 0902 85     Resp 10/23/16 0902 18     Temp 10/23/16 0902 97.9 F (36.6 C)     Temp Source 10/23/16 0902 Oral     SpO2 10/23/16 0902 98 %     Weight 10/23/16 0904 169 lb (76.7 kg)     Height 10/23/16 0904 5\' 9"  (1.753 m)     Head Circumference --      Peak Flow --      Pain Score 10/23/16 0918 2     Pain Loc --      Pain Edu? --      Excl. in GC? --     Constitutional: Alert and oriented. Well appearing and in no distress. Eyes: Conjunctivae are normal. PERRL. Normal extraocular movements. ENT   Head: Normocephalic and atraumatic.   Nose: No congestion/rhinnorhea.   Mouth/Throat: Mucous membranes are moist.   Neck: No stridor. Cardiovascular: Normal rate, regular rhythm. No murmurs, rubs, or gallops. Respiratory: Normal respiratory effort without tachypnea nor retractions. Breath sounds are clear and equal bilaterally. No wheezes/rales/rhonchi. Gastrointestinal: Soft and nontender. Normal bowel sounds Musculoskeletal: Nontender with normal range of motion in all extremities. No lower extremity tenderness nor edema. Neurologic:  Normal speech and language. No gross focal neurologic deficits are appreciated.  Skin:  Skin is warm, dry and intact. No rash noted. Psychiatric: Mood and affect are normal. Speech and behavior are normal.  ____________________________________________  ED COURSE:  Pertinent labs & imaging results that were available during my care of the patient were reviewed by me and considered in my medical decision making (see chart for details). Patient presents to ER in no distress with headache and reflux symptoms. We will assess with labs and imaging if needed.    Procedures ____________________________________________   LABS (pertinent positives/negatives)  Labs Reviewed  CBC WITH DIFFERENTIAL/PLATELET - Abnormal; Notable for the following:       Result Value   Neutro Abs 6.8 (*)    Lymphs Abs 0.7 (*)    All other components within normal limits  COMPREHENSIVE METABOLIC PANEL - Abnormal; Notable for the following:    Glucose, Bld 103 (*)    All other components within normal limits  LIPASE, BLOOD   ___________________________________________  FINAL ASSESSMENT AND PLAN  Headache, GERD  Plan: Patient with labs as dictated above. Patient is currently improved, x-rays here are reassuring. He'll be discharged with Prilosec, encouraged to have close follow-up with his doctor for recheck.   Emily FilbertWilliams, Jonathan E, MD   Note: This note was generated in part or whole with voice recognition software. Voice recognition is usually quite accurate but there are transcription errors that can and very often do occur. I apologize for any typographical errors that were not detected and corrected.     Emily FilbertJonathan E Williams, MD 10/23/16 1055

## 2016-10-23 NOTE — ED Triage Notes (Signed)
Pt with hx migraines has headache that started last nite. Pt took Excedrin this am with some relief. Pain now 2/10.  Also, pt is c/o food feeling like it is getting struck in throat which causes pain and nausea. Feels like food eventually passes.

## 2016-10-23 NOTE — ED Notes (Signed)
Patient transported to X-ray 

## 2017-04-02 ENCOUNTER — Ambulatory Visit (INDEPENDENT_AMBULATORY_CARE_PROVIDER_SITE_OTHER): Payer: Managed Care, Other (non HMO) | Admitting: Physician Assistant

## 2017-04-02 ENCOUNTER — Encounter: Payer: Self-pay | Admitting: Physician Assistant

## 2017-04-02 VITALS — BP 134/76 | HR 84 | Resp 16 | Wt 165.0 lb

## 2017-04-02 DIAGNOSIS — Z0000 Encounter for general adult medical examination without abnormal findings: Secondary | ICD-10-CM | POA: Diagnosis not present

## 2017-04-02 DIAGNOSIS — H9313 Tinnitus, bilateral: Secondary | ICD-10-CM

## 2017-04-02 DIAGNOSIS — Z1329 Encounter for screening for other suspected endocrine disorder: Secondary | ICD-10-CM

## 2017-04-02 DIAGNOSIS — Z7689 Persons encountering health services in other specified circumstances: Secondary | ICD-10-CM

## 2017-04-02 DIAGNOSIS — R030 Elevated blood-pressure reading, without diagnosis of hypertension: Secondary | ICD-10-CM

## 2017-04-02 DIAGNOSIS — Z13220 Encounter for screening for lipoid disorders: Secondary | ICD-10-CM | POA: Diagnosis not present

## 2017-04-02 DIAGNOSIS — Z131 Encounter for screening for diabetes mellitus: Secondary | ICD-10-CM | POA: Diagnosis not present

## 2017-04-02 DIAGNOSIS — R002 Palpitations: Secondary | ICD-10-CM | POA: Diagnosis not present

## 2017-04-02 NOTE — Progress Notes (Signed)
Patient: Bryan Coleman Male    DOB: [birth year]   41 y.o.   MRN: [id number] Visit Date: 04/03/2017  Today's Provider: Trey SailorsAdriana M Pollak, PA-C   Chief Complaint  Patient presents with  . Establish Care   Subjective:    HPI   Bryan Coleman is a 41 y/o man who goes by Bryan Coleman. He is presenting today to establish care. His previous PCP is Bryan Coleman.   He works for [employer] for the past 12 years, looking to leave. [place] in [place], [place]. He is single, not sexually active. Lives alone, no kids. Looking for a dog right now. Not smoking, using tobacco, alcohol, no drugs.   No history of colon cancer and prostate cancer.  Mom and dad living. Dad anemic, mom with no problems.   He has a history of [employer] Parkinson White Syndrome diagnosed by Dr. Welton FlakesKhan[facility]. He said he had an echo with Dr. Welton FlakesKhan. He was also on metoprolol 25 mg prescribed by Dr. Welton FlakesKhan for pre-HTN. He has not taken this since January because he felt it was not helping. Has not seen Dr. Welton FlakesKhan in one year.  He has history of migraines. He has 5-6 per month, usually triggered by missing meals when he is out working. He takes Excedrin for this with good relief. Does not want to be on migraine prophylaxis.  He had a colonoscopy on 03/13/2011 by Dr. Lutricia FeilPaul Coleman for abdominal pain, hematochezia and sigmoid bowel thickening on CT scan. Colonoscopy was normal with biopsy with active colitis. Diagnosis was chronic colitis and he was placed on Asacol 800 mg BID + 1 mo prednisone course. Has been off medications for three years. Has repeat colonoscopy in 2015 that I cannot see in Care Everywhere [facility].  He has a history of chronic prostatitis, was seeing Dr. Achilles Coleman in urology. Last seen in 06/2016. Not following up because symptoms resolved. Was on Flomax at one point, not any longer however.  He also mentions that he's been having ringing in his ears for the past 6-7 months. It is  constant. It is not pulsatile, does not sound like whooshing. No visual or balance changes, dizziness, nausea, vomiting.       Allergies  Allergen Reactions  . Camphor-Phenol Rash    blister  . Caramel Rash  . Chocolate Flavor Rash  . Dextromethorphan-Guaifenesin Rash  . Robitussin (Alcohol Free) [Guaifenesin] Rash  . Tape Rash    blister    No current outpatient prescriptions on file.  Review of Systems  HENT: Positive for tinnitus (Has been going on for several months.) and trouble swallowing (Only happens occasionally). Negative for congestion, dental problem, drooling, ear discharge, ear pain, facial swelling, hearing loss, mouth sores and nosebleeds.   Eyes: Negative.   Respiratory: Negative.   Cardiovascular: Negative.   Gastrointestinal: Positive for abdominal pain (History of diverticulitis). Negative for abdominal distention, anal bleeding, blood in stool, constipation, diarrhea, nausea, rectal pain and vomiting.  Endocrine: Negative.   Genitourinary: Positive for urgency (Chronic issue). Negative for decreased urine volume, difficulty urinating, discharge, dysuria, enuresis, flank pain, frequency, genital sores, hematuria, penile pain, penile swelling, scrotal swelling and testicular pain.  Musculoskeletal: Negative.   Skin: Negative.   Allergic/Immunologic: Positive for food allergies. Negative for environmental allergies and immunocompromised state.  Neurological: Negative for dizziness, tremors, seizures, syncope, facial asymmetry, speech difficulty, weakness, light-headedness, numbness and headaches.    Social History  Substance Use  Topics  . Smoking status: Never Smoker  . Smokeless tobacco: Never Used  . Alcohol use No   Objective:   BP 134/76 (BP Location: Left Arm, Patient Position: Sitting, Cuff Size: Normal)   Pulse 84   Resp 16   Wt 165 lb (74.8 kg)   SpO2 97%   BMI 24.37 kg/m  Vitals:   04/02/17 1349  BP: 134/76  Pulse: 84  Resp: 16  SpO2: 97%    Weight: 165 lb (74.8 kg)     Physical Exam  Constitutional: He is oriented to person, place, and time. He appears well-developed and well-nourished.  HENT:  Right Ear: Tympanic membrane and external ear normal.  Left Ear: Tympanic membrane and external ear normal.  Mouth/Throat: Oropharynx is clear and moist. No oropharyngeal exudate.  Eyes: Conjunctivae and EOM are normal.  Neck: Neck supple. Carotid bruit is not present. No thyromegaly present.  Cardiovascular: Normal rate and regular rhythm.   Pulmonary/Chest: Effort normal and breath sounds normal.  Abdominal: Soft. Bowel sounds are normal.  Lymphadenopathy:    He has no cervical adenopathy.  Neurological: He is alert and oriented to person, place, and time.  Skin: Skin is warm and dry.  Psychiatric: He has a normal mood and affect. His behavior is normal.        Assessment & Plan:     1. Annual physical exam  Last tetanus due in 2012. Need to get records from Bryan Coleman, previous PCP, and prior colonoscopy. Reviewed CBC and CMET from 10/01/2016, normal.   2. Encounter to establish care   3. Palpitations  Normal today. Don't see evidence of WPW. Patient does not want to see his cardiologist again.   - EKG 12-Lead  4. Lipid screening  - Lipid panel  5. Thyroid disorder screening  - TSH  6. Diabetes mellitus screening  - Hemoglobin A1c  7. Tinnitus of both ears  Declines ENT referral.  8. Elevated BP without diagnosis of hyertension  Does not wish to start medications at this point.   Return in about 6 months (around 10/03/2017) for BP recheck.  The entirety of the information documented in the History of Present Illness, Review of Systems and Physical Exam were personally obtained by me. Portions of this information were initially documented by Bryan Coleman, CMA and reviewed by me for thoroughness and accuracy.          Bryan Sailors, PA-C  [facility]

## 2017-04-02 NOTE — Patient Instructions (Addendum)
Need Release of information from [facility]: recent echo, wolff parkinson white treatment, BP treatment notes Need release of information from previous primary care provider  and also [facility] for colonoscopy    Health Maintenance, Male A healthy lifestyle and preventive care is important for your health and wellness. Ask your health care provider about what schedule of regular examinations is right for you. What should I know about weight and diet? Eat a Healthy Diet  Eat plenty of vegetables, fruits, whole grains, low-fat dairy products, and lean protein.  Do not eat a lot of foods high in solid fats, added sugars, or salt.  Maintain a Healthy Weight Regular exercise can help you achieve or maintain a healthy weight. You should:  Do at least 150 minutes of exercise each week. The exercise should increase your heart rate and make you sweat (moderate-intensity exercise).  Do strength-training exercises at least twice a week.  Watch Your Levels of Cholesterol and Blood Lipids  Have your blood tested for lipids and cholesterol every 5 years starting at 43 years of age. If you are at high risk for heart disease, you should start having your blood tested when you are 43 years old. You may need to have your cholesterol levels checked more often if: ? Your lipid or cholesterol levels are high. ? You are older than 43 years of age. ? You are at high risk for heart disease.  What should I know about cancer screening? Many types of cancers can be detected early and may often be prevented. Lung Cancer  You should be screened every year for lung cancer if: ? You are a current smoker who has smoked for at least 30 years. ? You are a former smoker who has quit within the past 15 years.  Talk to your health care provider about your screening options, when you should start screening, and how often you should be screened.  Colorectal Cancer  Routine colorectal cancer  screening usually begins at 43 years of age and should be repeated every 5-10 years until you are 43 years old. You may need to be screened more often if early forms of precancerous polyps or small growths are found. Your health care provider may recommend screening at an earlier age if you have risk factors for colon cancer.  Your health care provider may recommend using home test kits to check for hidden blood in the stool.  A small camera at the end of a tube can be used to examine your colon (sigmoidoscopy or colonoscopy). This checks for the earliest forms of colorectal cancer.  Prostate and Testicular Cancer  Depending on your age and overall health, your health care provider may do certain tests to screen for prostate and testicular cancer.  Talk to your health care provider about any symptoms or concerns you have about testicular or prostate cancer.  Skin Cancer  Check your skin from head to toe regularly.  Tell your health care provider about any new moles or changes in moles, especially if: ? There is a change in a mole's size, shape, or color. ? You have a mole that is larger than a pencil eraser.  Always use sunscreen. Apply sunscreen liberally and repeat throughout the day.  Protect yourself by wearing long sleeves, pants, a wide-brimmed hat, and sunglasses when outside.  What should I know about heart disease, diabetes, and high blood pressure?  If you are 26-91 years of age, have your blood pressure checked every  3-5 years. If you are 43 years of age or older, have your blood pressure checked every year. You should have your blood pressure measured twice--once when you are at a hospital or clinic, and once when you are not at a hospital or clinic. Record the average of the two measurements. To check your blood pressure when you are not at a hospital or clinic, you can use: ? An automated blood pressure machine at a pharmacy. ? A home blood pressure monitor.  Talk to your  health care provider about your target blood pressure.  If you are between 2845-43 years old, ask your health care provider if you should take aspirin to prevent heart disease.  Have regular diabetes screenings by checking your fasting blood sugar level. ? If you are at a normal weight and have a low risk for diabetes, have this test once every three years after the age of 43. ? If you are overweight and have a high risk for diabetes, consider being tested at a younger age or more often.  A one-time screening for abdominal aortic aneurysm (AAA) by ultrasound is recommended for men aged 65-75 years who are current or former smokers. What should I know about preventing infection? Hepatitis B If you have a higher risk for hepatitis B, you should be screened for this virus. Talk with your health care provider to find out if you are at risk for hepatitis B infection. Hepatitis C Blood testing is recommended for:  Everyone born from [id number] through 1967.  Anyone with known risk factors for hepatitis C.  Sexually Transmitted Diseases (STDs)  You should be screened each year for STDs including gonorrhea and chlamydia if: ? You are sexually active and are younger than 43 years of age. ? You are older than 43 years of age and your health care provider tells you that you are at risk for this type of infection. ? Your sexual activity has changed since you were last screened and you are at an increased risk for chlamydia or gonorrhea. Ask your health care provider if you are at risk.  Talk with your health care provider about whether you are at high risk of being infected with HIV. Your health care provider may recommend a prescription medicine to help prevent HIV infection.  What else can I do?  Schedule regular health, dental, and eye exams.  Stay current with your vaccines (immunizations).  Do not use any tobacco products, such as cigarettes, chewing tobacco, and e-cigarettes. If you need help  quitting, ask your health care provider.  Limit alcohol intake to no more than 2 drinks per day. One drink equals 12 ounces of beer, 5 ounces of wine, or 1 ounces of hard liquor.  Do not use street drugs.  Do not share needles.  Ask your health care provider for help if you need support or information about quitting drugs.  Tell your health care provider if you often feel depressed.  Tell your health care provider if you have ever been abused or do not feel safe at home. This information is not intended to replace advice given to you by your health care provider. Make sure you discuss any questions you have with your health care provider. Document Released: 03/08/2008 Document Revised: 05/09/2016 Document Reviewed: 06/14/2015 Elsevier Interactive Patient Education  [employer]2018 Elsevier Inc.

## 2017-04-04 ENCOUNTER — Telehealth: Payer: Self-pay

## 2017-04-04 LAB — TSH: TSH: 3.7 u[IU]/mL (ref 0.450–4.500)

## 2017-04-04 LAB — LIPID PANEL
Chol/HDL Ratio: 3.9 ratio (ref 0.0–5.0)
Cholesterol, Total: 167 mg/dL (ref 100–199)
HDL: 43 mg/dL (ref >39)
LDL Calculated: 94 mg/dL (ref 0–99)
Triglycerides: 151 mg/dL — ABNORMAL HIGH (ref 0–149)
VLDL Cholesterol Cal: 30 mg/dL (ref 5–40)

## 2017-04-04 LAB — HEMOGLOBIN A1C
Est. average glucose Bld gHb Est-mCnc: 108 mg/dL
Hgb A1c MFr Bld: 5.4 % (ref 4.8–5.6)

## 2017-04-04 NOTE — Telephone Encounter (Signed)
-----   Message from Trey SailorsAdriana M Pollak, [place]C sent at 04/04/2017  9:06 AM EDT ----- TSH normal. Total cholesterol normal, but triglycerides slightly high, please try to cut back on fast food if possible. A1C normal. See him back to check his BP. Thanks.

## 2017-04-04 NOTE — Telephone Encounter (Signed)
Attempted to contact patient. No answer or voicemail.  

## 2017-04-08 NOTE — Telephone Encounter (Signed)
Tried calling, pt's voicemail is not set up.   Thanks,   -Vernona RiegerLaura

## 2017-04-15 NOTE — Telephone Encounter (Signed)
Pt advised.   Thanks,   -Laura  

## 2017-10-11 ENCOUNTER — Encounter: Payer: Self-pay | Admitting: Physician Assistant

## 2017-10-11 ENCOUNTER — Ambulatory Visit: Payer: Managed Care, Other (non HMO) | Admitting: Physician Assistant

## 2017-10-11 VITALS — BP 126/88 | HR 72 | Temp 98.5°F | Resp 16 | Wt 167.0 lb

## 2017-10-11 DIAGNOSIS — R030 Elevated blood-pressure reading, without diagnosis of hypertension: Secondary | ICD-10-CM | POA: Diagnosis not present

## 2017-10-11 NOTE — Progress Notes (Signed)
Patient: Bryan Coleman Male    DOB: Jan 31, 1976   42 y.o.   MRN: [id number] Visit Date: 10/11/2017  Today's Provider: Trey Sailors, PA-C   No chief complaint on file.  Subjective:    Bryan Coleman is a 42 y/o man here for Bp recheck. At initial visit and physical last July, he reports he was pre-HTN and had recently discontinued metoprolol for [employer]. No complaints except his typical migraines.   Migraine   This is a chronic problem. The problem has been unchanged (Pt reports getting 2-3 migraines a week.  ). The pain is located in the occipital region. The pain radiates to the face (Mainly on the left side). The pain quality is similar to prior headaches. Associated symptoms include rhinorrhea and sinus pressure. Pertinent negatives include no abdominal pain, dizziness, loss of balance, nausea, neck pain, phonophobia, photophobia, scalp tenderness or sore throat. He has tried Excedrin for the symptoms. The treatment provided significant relief. His past medical history is significant for hypertension.  Hypertension  Associated symptoms include headaches (History of Migraines; two to three a week.). Pertinent negatives include no anxiety, malaise/fatigue, neck pain, palpitations, peripheral edema or shortness of breath.       Allergies  Allergen Reactions  . Camphor-Phenol Rash    blister  . Caramel Rash  . Chocolate Flavor Rash  . Dextromethorphan-Guaifenesin Rash  . Robitussin (Alcohol Free) [Guaifenesin] Rash  . Tape Rash    blister    No current outpatient medications on file.  Review of Systems  Constitutional: Negative.  Negative for malaise/fatigue.  HENT: Positive for congestion, rhinorrhea, sinus pressure and sneezing. Negative for nosebleeds, postnasal drip and sore throat.        Started this morning.   Eyes: Negative for photophobia.  Respiratory: Negative.  Negative for shortness of breath.   Cardiovascular: Negative.  Negative for  palpitations.  Gastrointestinal: Negative.  Negative for abdominal pain and nausea.  Musculoskeletal: Negative for neck pain.  Neurological: Positive for headaches (History of Migraines; two to three a week.). Negative for dizziness, light-headedness and loss of balance.    Social History   Tobacco Use  . Smoking status: Never Smoker  . Smokeless tobacco: Never Used  Substance Use Topics  . Alcohol use: No   Objective:   BP 126/88 (BP Location: Right Arm, Patient Position: Sitting, Cuff Size: Normal)   Pulse 72   Temp 98.5 F (36.9 C) (Oral)   Resp 16   Wt 167 lb (75.8 kg)   BMI 24.66 kg/m  Vitals:   10/11/17 1411  BP: 126/88  Pulse: 72  Resp: 16  Temp: 98.5 F (36.9 C)  TempSrc: Oral  Weight: 167 lb (75.8 kg)     Physical Exam  Constitutional: He is oriented to person, place, and time. He appears well-developed and well-nourished.  Cardiovascular: Normal rate and regular rhythm.  Pulmonary/Chest: Effort normal and breath sounds normal.  Neurological: He is alert and oriented to person, place, and time.  Skin: Skin is warm and dry.  Psychiatric: He has a normal mood and affect. His behavior is normal.        Assessment & Plan:     1. Elevated BP without diagnosis of hypertension  BP normal today. Just annual physicals from now on.   Return in about 6 months (around 04/10/2018) for CPE.  The entirety of the information documented in the History of Present Illness, Review of Systems and Physical  Exam were personally obtained by me. Portions of this information were initially documented by Kavin LeechLaura Walsh, CMA and reviewed by me for thoroughness and accuracy.          Trey SailorsAdriana M Pollak, PA-C  [facility]

## 2017-10-11 NOTE — Patient Instructions (Signed)
Wolff-Parkinson-White Syndrome Wolff-Parkinson-White (WPW) syndrome is a heart condition that causes a fast and irregular heartbeat (arrhythmia) that comes and goes suddenly. People who have this condition were born with it (it is congenital). However, symptoms may not appear until the teen or adult years. What are the causes? This condition is caused by an extra electrical connection (pathway) between the top chambers of your heart (atria) and the bottom chambers of your heart (ventricles). This can cause an abnormal heart rhythm that comes and goes. What increases the risk? This condition is more likely to develop in people:  Who have a family history of WPW.  Who have another congenital heart defect.  Who are male.  What are the signs or symptoms? Symptoms of this condition vary. Some people never have symptoms. Others have symptom that start at birth. Usually, symptoms start at 4130-44 years of age. Symptoms start suddenly and may last for several minutes or a few hours. They are often triggered by exercise. If you have symptoms, they may include:  Feeling your heart "skip" beats (palpitations).  A fast heart rate.  Shortness of breath.  Light-headedness or dizziness.  Fatigue, especially with exercise.  Anxiety.  Chest pain.  Fainting.  Stopping of your heart (cardiac arrest). This is rare.  How is this diagnosed? This condition is diagnosed with a medical history, physical exam, and electrical tests of your heart. Heart testing may include:  Electrocardiogram (ECG). This test shows the electrical activity of your heart.  Echocardiogram. This test uses sound waves to create an image of your heart.  Holter monitoring. This is a portable ECG that you wear for 24 hours.  Stress testing. This test is an ECG that is done during exercise.  Electrophysiology study. This test measures electrical activity in your heart. It uses a thin tube (catheter) that is inserted in your  heart through a blood vessel.  How is this treated? Treatment for WPW depends on how often you have symptoms and what type of extra pathway you have. Treatment may include:  Medicine to stop the arrhythmia. You may get this medicine through an IV tube to stop an attack. Or, you may take it orally to prevent an attack.  Cardioversion. This is a controlled electrical shock. In extreme circumstances, it may be used to return your heart rate to normal.  Radiofrequency ablation. This is a surgical procedure that uses high-frequency radio waves to destroy the extra pathway.  In some cases, your health care provider may only watch your condition for any changes. Follow these instructions at home:  Take over-the-counter and prescription medicines only as told by your health care provider.  Follow any exercise restrictions as told by your health care provider.  Keep all follow-up visits as told by your health care provider. This is important. Contact a health care provider if:  You are having symptoms of WPW.  Medicines do not control your symptoms. Get help right away if:  You have chest pain or difficulty breathing.  You pass out. This information is not intended to replace advice given to you by your health care provider. Make sure you discuss any questions you have with your health care provider. Document Released: 12/01/2003 Document Revised: 02/16/2016 Document Reviewed: 03/30/2015 Elsevier Interactive Patient Education  2018 [employer]

## 2017-10-17 ENCOUNTER — Telehealth: Payer: Self-pay | Admitting: Physician Assistant

## 2017-10-17 NOTE — Telephone Encounter (Signed)
Patient bought his wellness screening form in to be filled out for his work. Patient states he had his physical back in 03/2017 with Adriana. Patient states we can just fill out the form, fax it ( fax # [phone] ) and file in patients chart. I placed this form in Adriana's box.If there is any questions patient states we can call him CB# [phone]. Thanks CC

## 2017-10-18 NOTE — Telephone Encounter (Signed)
Form faxed.   Thanks,   -Vernona RiegerLaura

## 2017-10-18 NOTE — Telephone Encounter (Signed)
Form completed.

## 2018-02-03 ENCOUNTER — Ambulatory Visit: Payer: Self-pay | Admitting: Family Medicine

## 2018-02-03 VITALS — BP 141/97 | HR 68 | Temp 97.6°F | Resp 15 | Ht 69.0 in | Wt 168.0 lb

## 2018-02-03 DIAGNOSIS — Z0189 Encounter for other specified special examinations: Principal | ICD-10-CM

## 2018-02-03 DIAGNOSIS — Z008 Encounter for other general examination: Secondary | ICD-10-CM

## 2018-02-03 LAB — GLUCOSE, POCT (MANUAL RESULT ENTRY): POC GLUCOSE: 91 mg/dL (ref 70–99)

## 2018-02-03 NOTE — Progress Notes (Signed)
Subjective: Annual biometrics screening  Patient presents for his annual biometric screening.  Patient reports seeing his primary care provider regularly.  Patient denies eating a healthy, well-rounded diet.  Patient reports getting regular exercise. PCP: Dr. Jodi Marble. Patient works for [employer]. Patient denies any other issues or concerns.   Review of Systems Unremarkable  Objective  Physical Exam General: Awake, alert and oriented. No acute distress. Well developed, hydrated and nourished. Appears stated age.  HEENT: Supple neck without adenopathy. Sclera is non-icteric. The ear canal is clear without discharge. The tympanic membrane is normal in appearance with normal landmarks and cone of light. Nasal mucosa is pink and moist. Oral mucosa is pink and moist. The pharynx is normal in appearance without tonsillar swelling or exudates.  Skin: Skin in warm, dry and intact without rashes or lesions. Appropriate color for ethnicity. Cardiac: Heart rate and rhythm are normal. No murmurs, gallops, or rubs are auscultated.  Respiratory: The chest wall is symmetric and without deformity. No signs of respiratory distress. Lung sounds are clear in all lobes bilaterally without rales, ronchi, or wheezes.  Neurological: The patient is awake, alert and oriented to person, place, and time with normal speech.  Memory is normal and thought processes intact. No gait abnormalities are appreciated.  Psychiatric: Appropriate mood and affect.   Assessment Annual biometrics screening  Plan  Lipid panel pending. Encouraged routine visits with primary care provider.  Fasting blood sugar is 91 today. Patient's blood pressure is 141/97 today.  Advised patient monitor his blood pressure at home and report abnormal values to his primary care provider. Encouraged patient to get regular exercise and eat a healthy, well-rounded diet.

## 2018-02-04 LAB — LIPID PANEL
CHOL/HDL RATIO: 3.6 ratio (ref 0.0–5.0)
Cholesterol, Total: 135 mg/dL (ref 100–199)
HDL: 38 mg/dL — ABNORMAL LOW (ref >39)
LDL Calculated: 59 mg/dL (ref 0–99)
TRIGLYCERIDES: 191 mg/dL — AB (ref 0–149)
VLDL Cholesterol Cal: 38 mg/dL (ref 5–40)

## 2018-02-05 NOTE — Progress Notes (Signed)
Bryan Coleman, Will you call the patient and inform them that their lipid panel came back?  Everything is normal, with the exception of his triglyceride level and HDL cholesterol. The triglyceride level is elevated at 191, normal values are between 0 and 149.  Last year his triglyceride level was 151. The HDL cholesterol ("good cholesterol") is decreased at 38, normal values are above 39.  Last year his HDL cholesterol was 43.  Please advise the patient to follow-up with their primary care provider regarding these results.

## 2019-02-06 ENCOUNTER — Other Ambulatory Visit: Payer: Self-pay

## 2019-02-06 ENCOUNTER — Encounter: Payer: Self-pay | Admitting: Physician Assistant

## 2019-02-06 ENCOUNTER — Ambulatory Visit (INDEPENDENT_AMBULATORY_CARE_PROVIDER_SITE_OTHER): Payer: Managed Care, Other (non HMO) | Admitting: Physician Assistant

## 2019-02-06 VITALS — BP 146/84 | HR 67 | Temp 98.0°F | Resp 16 | Ht 69.0 in | Wt 167.2 lb

## 2019-02-06 DIAGNOSIS — I10 Essential (primary) hypertension: Secondary | ICD-10-CM | POA: Diagnosis not present

## 2019-02-06 DIAGNOSIS — Z114 Encounter for screening for human immunodeficiency virus [HIV]: Secondary | ICD-10-CM | POA: Diagnosis not present

## 2019-02-06 DIAGNOSIS — Z0000 Encounter for general adult medical examination without abnormal findings: Secondary | ICD-10-CM | POA: Diagnosis not present

## 2019-02-06 MED ORDER — AMLODIPINE BESYLATE 5 MG PO TABS: 5.0000 mg | ORAL_TABLET | Freq: Every day | ORAL | 0 refills | Status: DC

## 2019-02-06 NOTE — Patient Instructions (Signed)

## 2019-02-06 NOTE — Progress Notes (Signed)
Patient: Bryan Coleman, Male    DOB: 1975/10/06, 43 y.o.   MRN: [id number] Visit Date: 02/06/2019  Today's Provider: Trey Sailors, PA-C   Chief Complaint  Patient presents with  . Annual Exam  . Hypertension   Subjective:    Annual physical exam Bryan Coleman is a 43 y.o. male who presents today for health maintenance and complete physical. He feels well. He reports exercising walking daily. He reports he is sleeping well. 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   Reports he has been having some intermittent BRB per rectum, does report some constipation. Reports history of colonoscopy 2-3 years prior that revealed internal hemorrhoids.   Does not smoke or use alcohol. -----------------------------------------------------------------  Hypertension, follow-up:  BP Readings from Last 3 Encounters:  02/06/19 (!) 146/84  02/03/18 (!) 141/97  10/11/17 126/88    He was last seen for hypertension 1 years ago.  BP at that visit was 126/88. Management changes since that visit include none. Patient is staying active walking daily He is not adherent to low salt diet.   Outside blood pressures are 148/88 . He is experiencing none.  Patient denies chest pain, chest pressure/discomfort, claudication, dyspnea, exertional chest pressure/discomfort, fatigue, irregular heart beat, lower extremity edema, near-syncope, orthopnea, palpitations, paroxysmal nocturnal dyspnea, syncope and tachypnea.   Cardiovascular risk factors include male gender.  Use of agents associated with hypertension: none.     Weight trend: stable Wt Readings from Last 3 Encounters:  02/06/19 167 lb 3.2 oz (75.8 kg)  02/03/18 168 lb (76.2 kg)  10/11/17 167 lb (75.8 kg)    Current diet: in general, an "unhealthy" diet  ------------------------------------------------------------------------   Review of Systems  Constitutional: Negative.   HENT: Negative.   Eyes: Negative.   Respiratory: Negative.     Gastrointestinal: Positive for anal bleeding (new onset ) and constipation (reports started a week ago).  Genitourinary: Negative.   Musculoskeletal: Negative.   Allergic/Immunologic: Positive for environmental allergies and food allergies.  Neurological: Negative.   Hematological: Negative.   Psychiatric/Behavioral: Negative.     Social History He  reports that he has never smoked. He has never used smokeless tobacco. He reports that he does not drink alcohol or use drugs. Social History   Socioeconomic History  . Marital status: Single    Spouse name: Not on file  . Number of children: Not on file  . Years of education: Not on file  . Highest education level: Not on file  Occupational History  . Not on file  Social Needs  . Financial resource strain: Not on file  . Food insecurity:    Worry: Not on file    Inability: Not on file  . Transportation needs:    Medical: Not on file    Non-medical: Not on file  Tobacco Use  . Smoking status: Never Smoker  . Smokeless tobacco: Never Used  Substance and Sexual Activity  . Alcohol use: No  . Drug use: No  . Sexual activity: Not on file  Lifestyle  . Physical activity:    Days per week: Not on file    Minutes per session: Not on file  . Stress: Not on file  Relationships  . Social connections:    Talks on phone: Not on file    Gets together: Not on file    Attends religious service: Not on file    Active member of club or organization: Not on file  Attends meetings of clubs or organizations: Not on file    Relationship status: Not on file  Other Topics Concern  . Not on file  Social History Narrative  . Not on file    Patient Active Problem List   Diagnosis Date Noted  . Migraine 10/17/2016  . History of migraine 10/17/2016  . WPW (Wolff-Parkinson-White syndrome) 10/17/2016  . Diverticulitis 10/17/2016  . Patellofemoral stress syndrome of left knee 12/28/2014  . Abdominal pain, chronic, right lower quadrant  04/28/2013  . Benign localized hyperplasia of prostate with urinary obstruction 09/29/2012    Past Surgical History:  Procedure Laterality Date  . COLONOSCOPY  03-10-12   Dr Bluford Kaufmann    Family History  Family Status  Relation Name Status  . Mother  Alive  . Father  Alive   His family history includes Anemia in his father; Healthy in his mother.     Allergies  Allergen Reactions  . Camphor-Phenol Rash    blister  . Caramel Rash  . Chocolate Flavor Rash  . Dextromethorphan-Guaifenesin Rash  . Robitussin (Alcohol Free) [Guaifenesin] Rash  . Tape Rash    blister    Previous Medications   No medications on file    Patient Care Team: Maryella Shivers as PCP - General (Physician Assistant) Kieth Brightly, MD (General Surgery) Sherron Monday, MD as Referring Physician (Internal Medicine)      Objective:   Vitals: BP (!) 146/84   Pulse 67   Temp 98 F (36.7 C) (Oral)   Resp 16   Ht  (1.753 m)   Wt 167 lb 3.2 oz (75.8 kg)   BMI 24.69 kg/m    Physical Exam Constitutional:      Appearance: Normal appearance.  HENT:     Right Ear: Tympanic membrane and ear canal normal.     Left Ear: Tympanic membrane and ear canal normal.  Neck:     Musculoskeletal: Neck supple.  Cardiovascular:     Rate and Rhythm: Normal rate and regular rhythm.     Heart sounds: Normal heart sounds.  Pulmonary:     Effort: Pulmonary effort is normal.     Breath sounds: Normal breath sounds.  Abdominal:     General: Abdomen is flat. Bowel sounds are normal.     Palpations: Abdomen is soft.  Lymphadenopathy:     Cervical: No cervical adenopathy.  Skin:    General: Skin is warm and dry.  Neurological:     Mental Status: He is alert and oriented to person, place, and time. Mental status is at baseline.  Psychiatric:        Mood and Affect: Mood normal.        Behavior: Behavior normal.      Depression Screen PHQ 2/9 Scores 02/06/2019  PHQ - 2 Score 0  PHQ- 9  Score 0      Assessment & Plan:     Routine Health Maintenance and Physical Exam  Exercise Activities and Dietary recommendations Goals   None      There is no immunization history on file for this patient.  Health Maintenance  Topic Date Due  . HIV Screening  03/11/1989  . TETANUS/TDAP  03/11/1993  . INFLUENZA VACCINE  04/25/2019     Discussed health benefits of physical activity, and encouraged him to engage in regular exercise appropriate for his age and condition.    1. Annual physical exam  Will fill out biometric forms pending labwork.  2.  Essential hypertension  Uncontrolled today, will start amlodipine as below and ff/u in 3 months.  - Comprehensive Metabolic Panel (CMET) - CBC with Differential - Lipid Profile - TSH - amLODipine (NORVASC) 5 MG tablet; Take 1 tablet (5 mg total) by mouth daily.  Dispense: 90 tablet; Refill: 0  3. Encounter for screening for HIV  - HIV antibody (with reflex)  The entirety of the information documented in the History of Present Illness, Review of Systems and Physical Exam were personally obtained by me. Portions of this information were initially documented by Sheliah HatchKathleen Wolford, CMA and reviewed by me for thoroughness and accuracy.      -------------------------------------------------------------------- Leo RodI,Kathleen J Wolford,acting as a scribe for Trey SailorsAdriana M Karissa Meenan, PA-C.,have documented all relevant documentation on the behalf of Trey Sailorsdriana M Dorion Petillo, PA-C,as directed by  Trey SailorsAdriana M Ashlee Player, PA-C while in the presence of Trey SailorsAdriana M Saima Monterroso, PA-C.

## 2019-02-07 LAB — COMPREHENSIVE METABOLIC PANEL
ALT: 18 IU/L (ref 0–44)
AST: 16 IU/L (ref 0–40)
Albumin/Globulin Ratio: 1.8 (ref 1.2–2.2)
Albumin: 4.3 g/dL (ref 4.0–5.0)
Alkaline Phosphatase: 72 IU/L (ref 39–117)
BUN/Creatinine Ratio: 12 (ref 9–20)
BUN: 10 mg/dL (ref 6–24)
Bilirubin Total: 0.5 mg/dL (ref 0.0–1.2)
CO2: 24 mmol/L (ref 20–29)
Calcium: 8.6 mg/dL — ABNORMAL LOW (ref 8.7–10.2)
Chloride: 103 mmol/L (ref 96–106)
Creatinine, Ser: 0.83 mg/dL (ref 0.76–1.27)
GFR calc Af Amer: 124 mL/min/{1.73_m2} (ref >59)
GFR calc non Af Amer: 107 mL/min/{1.73_m2} (ref >59)
Globulin, Total: 2.4 g/dL (ref 1.5–4.5)
Glucose: 86 mg/dL (ref 65–99)
Potassium: 4.2 mmol/L (ref 3.5–5.2)
Sodium: 139 mmol/L (ref 134–144)
Total Protein: 6.7 g/dL (ref 6.0–8.5)

## 2019-02-07 LAB — LIPID PANEL
Chol/HDL Ratio: 3.9 ratio (ref 0.0–5.0)
Cholesterol, Total: 148 mg/dL (ref 100–199)
HDL: 38 mg/dL — ABNORMAL LOW (ref >39)
LDL Calculated: 84 mg/dL (ref 0–99)
Triglycerides: 129 mg/dL (ref 0–149)
VLDL Cholesterol Cal: 26 mg/dL (ref 5–40)

## 2019-02-07 LAB — CBC WITH DIFFERENTIAL/PLATELET
Basophils Absolute: 0 10*3/uL (ref 0.0–0.2)
Basos: 1 %
EOS (ABSOLUTE): 0 10*3/uL (ref 0.0–0.4)
Eos: 1 %
Hematocrit: 42.1 % (ref 37.5–51.0)
Hemoglobin: 14.3 g/dL (ref 13.0–17.7)
Immature Grans (Abs): 0 10*3/uL (ref 0.0–0.1)
Immature Granulocytes: 0 %
Lymphocytes Absolute: 1.1 10*3/uL (ref 0.7–3.1)
Lymphs: 19 %
MCH: 29.5 pg (ref 26.6–33.0)
MCHC: 34 g/dL (ref 31.5–35.7)
MCV: 87 fL (ref 79–97)
Monocytes Absolute: 0.4 10*3/uL (ref 0.1–0.9)
Monocytes: 7 %
Neutrophils Absolute: 3.9 10*3/uL (ref 1.4–7.0)
Neutrophils: 72 %
Platelets: 202 10*3/uL (ref 150–450)
RBC: 4.84 x10E6/uL (ref 4.14–5.80)
RDW: 12.6 % (ref 11.6–15.4)
WBC: 5.4 10*3/uL (ref 3.4–10.8)

## 2019-02-07 LAB — HIV ANTIBODY (ROUTINE TESTING W REFLEX): HIV Screen 4th Generation wRfx: NONREACTIVE

## 2019-02-07 LAB — TSH: TSH: 3.74 u[IU]/mL (ref 0.450–4.500)

## 2019-02-12 ENCOUNTER — Telehealth: Payer: Self-pay

## 2019-02-12 NOTE — Telephone Encounter (Signed)
Patient has been advised. KW 

## 2019-02-12 NOTE — Telephone Encounter (Signed)
-----   Message from Trey Sailors, [place] sent at 02/10/2019  1:17 PM EDT ----- Loney Laurence looks good, have completed and faxed form.

## 2019-05-11 ENCOUNTER — Other Ambulatory Visit: Payer: Self-pay

## 2019-05-11 ENCOUNTER — Encounter: Payer: Self-pay | Admitting: Physician Assistant

## 2019-05-11 ENCOUNTER — Ambulatory Visit: Payer: Managed Care, Other (non HMO) | Admitting: Physician Assistant

## 2019-05-11 VITALS — BP 134/86 | HR 100 | Temp 97.5°F | Resp 18 | Wt 170.4 lb

## 2019-05-11 DIAGNOSIS — I10 Essential (primary) hypertension: Secondary | ICD-10-CM

## 2019-05-11 MED ORDER — AMLODIPINE BESYLATE 5 MG PO TABS: 5.0000 mg | ORAL_TABLET | Freq: Every day | ORAL | 11 refills | Status: DC

## 2019-05-11 NOTE — Patient Instructions (Signed)

## 2019-05-11 NOTE — Progress Notes (Signed)
       Patient: Bryan Coleman Male    DOB: 12-11-1975   43 y.o.   MRN: [id number] Visit Date: 05/11/2019  Today's Provider: Trinna Post, PA-C   Chief Complaint  Patient presents with  . Hypertension  . Follow-up   Subjective:     HPI 3 Month Follow Up for Hypertension. Medication refill for Amlodipine 5 mg. Started 3 months ago for HTN but patient reports he has been out of the medication for a little because the bottle ran out and he saw there were no refills. No issues with the medication, no SOB or chest pain.   Allergies  Allergen Reactions  . Camphor-Phenol Rash    blister  . Caramel Rash  . Chocolate Flavor Rash  . Dextromethorphan-Guaifenesin Rash  . Robitussin (Alcohol Free) [Guaifenesin] Rash  . Tape Rash    blister     Current Outpatient Medications:  .  amLODipine (NORVASC) 5 MG tablet, Take 1 tablet (5 mg total) by mouth daily., Disp: 90 tablet, Rfl: 0  Review of Systems  All other systems reviewed and are negative.   Social History   Tobacco Use  . Smoking status: Never Smoker  . Smokeless tobacco: Never Used  Substance Use Topics  . Alcohol use: No      Objective:   BP 134/86 (BP Location: Right Arm, Patient Position: Sitting, Cuff Size: Normal)   Pulse 100   Temp (!) 97.5 F (36.4 C) (Temporal)   Resp 18   Wt 170 lb 6.4 oz (77.3 kg)   SpO2 98%   BMI 25.16 kg/m  Vitals:   05/11/19 1347  BP: 134/86  Pulse: 100  Resp: 18  Temp: (!) 97.5 F (36.4 C)  TempSrc: Temporal  SpO2: 98%  Weight: 170 lb 6.4 oz (77.3 kg)     Physical Exam Constitutional:      Appearance: Normal appearance.  Cardiovascular:     Rate and Rhythm: Normal rate and regular rhythm.     Heart sounds: Normal heart sounds.  Pulmonary:     Effort: Pulmonary effort is normal.     Breath sounds: Normal breath sounds.  Skin:    General: Skin is warm and dry.  Neurological:     Mental Status: He is alert and oriented to person, place, and time. Mental status  is at baseline.  Psychiatric:        Behavior: Behavior normal.      No results found for any visits on 05/11/19.     Assessment & Plan    1. Essential hypertension  - amLODipine (NORVASC) 5 MG tablet; Take 1 tablet (5 mg total) by mouth daily.  Dispense: 30 tablet; Refill: 11  The entirety of the information documented in the History of Present Illness, Review of Systems and Physical Exam were personally obtained by me. Portions of this information were initially documented by Doran Clay, LPN and reviewed by me for thoroughness and accuracy.    F/u 1 year for CPE      Tai Skelly M Jaceyon Strole, PA-C  [place]

## 2020-03-23 NOTE — Progress Notes (Signed)
Complete physical exam   Patient: Bryan Coleman   DOB: 02/21/1976   44 y.o. Male  MRN: [id number] Visit Date: 03/24/2020  Today's healthcare provider: Trey SailorsAdriana M Pollak, PA-C   Chief Complaint  Patient presents with  . Annual Exam  . Hypertension   Subjective    Bryan Coleman is a 44 y.o. male who presents today for a complete physical exam.  He reports consuming a general diet. The patient does not participate in regular exercise at present. He generally feels well. He reports sleeping fairly well. He does not have additional problems to discuss today.   Hypertension, follow-up  BP Readings from Last 3 Encounters:  03/25/20 136/86  05/11/19 134/86  02/06/19 (!) 146/84   Wt Readings from Last 3 Encounters:  03/24/20 166 lb 12.8 oz (75.7 kg)  05/11/19 170 lb 6.4 oz (77.3 kg)  02/06/19 167 lb 3.2 oz (75.8 kg)     He was last seen for hypertension 1 months ago.  BP at that visit was 134/86. Management since that visit includes no changes.  He reports good compliance with treatment. He is not having side effects.  He is following a Regular diet. He is not exercising. He does not smoke.  Use of agents associated with hypertension: none.   Outside blood pressures are not being checked. Symptoms: No chest pain No chest pressure  No palpitations No syncope  No dyspnea No orthopnea  No paroxysmal nocturnal dyspnea No lower extremity edema   Pertinent labs: Lab Results  Component Value Date   CHOL 168 03/24/2020   HDL 43 03/24/2020   LDLCALC 95 03/24/2020   TRIG 176 (H) 03/24/2020   CHOLHDL 3.9 03/24/2020   Lab Results  Component Value Date   NA 141 03/24/2020   K 4.5 03/24/2020   CREATININE 0.77 03/24/2020   GFRNONAA 109 03/24/2020   GFRAA 126 03/24/2020   GLUCOSE 86 03/24/2020     The 10-year ASCVD risk score Denman George(Goff DC Jr., et al., 2013) is: 2.7%   ---------------------------------------------------------------------------------------------------    Past Medical History:  Diagnosis Date  . Hypertension   . WPW (Wolff-Parkinson-White syndrome)    Past Surgical History:  Procedure Laterality Date  . COLONOSCOPY  03-10-12   Dr Bluford Kaufmannh   Social History   Socioeconomic History  . Marital status: Single    Spouse name: Not on file  . Number of children: Not on file  . Years of education: Not on file  . Highest education level: Not on file  Occupational History  . Not on file  Tobacco Use  . Smoking status: Never Smoker  . Smokeless tobacco: Never Used  Vaping Use  . Vaping Use: Never used  Substance and Sexual Activity  . Alcohol use: No  . Drug use: No  . Sexual activity: Not on file  Other Topics Concern  . Not on file  Social History Narrative  . Not on file   Social Determinants of Health   Financial Resource Strain:   . Difficulty of Paying Living Expenses:   Food Insecurity:   . Worried About Programme researcher, broadcasting/film/videounning Out of Food in the Last Year:   . Baristaan Out of Food in the Last Year:   Transportation Needs:   . Freight forwarderLack of Transportation (Medical):   Marland Kitchen. Lack of Transportation (Non-Medical):   Physical Activity:   . Days of Exercise per Week:   . Minutes of Exercise per Session:   Stress:   . Feeling of Stress :  Social Connections:   . Frequency of Communication with Friends and Family:   . Frequency of Social Gatherings with Friends and Family:   . Attends Religious Services:   . Active Member of Clubs or Organizations:   . Attends Banker Meetings:   Marland Kitchen Marital Status:   Intimate Partner Violence:   . Fear of Current or Ex-Partner:   . Emotionally Abused:   Marland Kitchen Physically Abused:   . Sexually Abused:    Family Status  Relation Name Status  . Mother  Alive  . Father  Alive   Family History  Problem Relation Age of Onset  . Healthy Mother   . Anemia Father    Allergies  Allergen Reactions  . Camphor-Phenol Rash    blister  . Caramel Rash  . Chocolate Flavor Rash  . Dextromethorphan-Guaifenesin Rash   . Robitussin (Alcohol Free) [Guaifenesin] Rash  . Tape Rash    blister    Patient Care Team: Maryella Shivers as PCP - General (Physician Assistant) Kieth Brightly, MD (General Surgery) Sherron Monday, MD as Referring Physician (Internal Medicine)   Medications: Outpatient Medications Prior to Visit  Medication Sig  . [DISCONTINUED] amLODipine (NORVASC) 5 MG tablet Take 1 tablet (5 mg total) by mouth daily.   No facility-administered medications prior to visit.    Review of Systems  Constitutional: Negative.   HENT: Negative.   Eyes: Negative.   Respiratory: Negative.   Cardiovascular: Negative.   Gastrointestinal: Negative.   Endocrine: Negative.   Genitourinary: Negative.   Musculoskeletal: Negative.   Skin: Negative.   Allergic/Immunologic: Negative.   Neurological: Negative.   Hematological: Negative.   Psychiatric/Behavioral: Negative.        Objective    BP 136/86   Pulse 77   Temp 98.1 F (36.7 C) (Temporal)   Ht 5\' 9"  (1.753 m)   Wt 166 lb 12.8 oz (75.7 kg)   BMI 24.63 kg/m    Physical Exam Constitutional:      Appearance: Normal appearance.  HENT:     Right Ear: Tympanic membrane normal.     Left Ear: Tympanic membrane normal.  Cardiovascular:     Rate and Rhythm: Normal rate and regular rhythm.     Pulses: Normal pulses.     Heart sounds: Normal heart sounds.  Pulmonary:     Effort: Pulmonary effort is normal.     Breath sounds: Normal breath sounds.  Abdominal:     General: Bowel sounds are normal.  Skin:    General: Skin is warm and dry.  Neurological:     Mental Status: He is alert and oriented to person, place, and time. Mental status is at baseline.  Psychiatric:        Mood and Affect: Mood normal.        Behavior: Behavior normal.       Last depression screening scores PHQ 2/9 Scores 03/24/2020 02/06/2019  PHQ - 2 Score 0 0  PHQ- 9 Score - 0   Last fall risk screening Fall Risk  03/24/2020  Falls in the  past year? 0  Number falls in past yr: 0  Injury with Fall? 0  Risk for fall due to : No Fall Risks  Follow up Falls evaluation completed   Last Audit-C alcohol use screening Alcohol Use Disorder Test (AUDIT) 03/24/2020  1. How often do you have a drink containing alcohol? 0  2. How many drinks containing alcohol do you have on a typical  day when you are drinking? 0  3. How often do you have six or more drinks on one occasion? 0  AUDIT-C Score 0  Alcohol Brief Interventions/Follow-up AUDIT Score <7 follow-up not indicated   A score of 3 or more in women, and 4 or more in men indicates increased risk for alcohol abuse, EXCEPT if all of the points are from question 1   Results for orders placed or performed in visit on 03/24/20  CBC with Differential/Platelet  Result Value Ref Range   WBC 4.9 3.4 - 10.8 x10E3/uL   RBC 4.88 4.14 - 5.80 x10E6/uL   Hemoglobin 14.7 13.0 - 17.7 g/dL   Hematocrit 23.5 57.3 - 51.0 %   MCV 88 79 - 97 fL   MCH 30.1 26.6 - 33.0 pg   MCHC 34.4 31 - 35 g/dL   RDW 22.0 25.4 - 27.0 %   Platelets 234 150 - 450 x10E3/uL   Neutrophils 65 Not Estab. %   Lymphs 23 Not Estab. %   Monocytes 10 Not Estab. %   Eos 1 Not Estab. %   Basos 1 Not Estab. %   Neutrophils Absolute 3.2 1 - 7 x10E3/uL   Lymphocytes Absolute 1.1 0 - 3 x10E3/uL   Monocytes Absolute 0.5 0 - 0 x10E3/uL   EOS (ABSOLUTE) 0.1 0.0 - 0.4 x10E3/uL   Basophils Absolute 0.0 0 - 0 x10E3/uL   Immature Granulocytes 0 Not Estab. %   Immature Grans (Abs) 0.0 0.0 - 0.1 x10E3/uL  Comprehensive metabolic panel  Result Value Ref Range   Glucose 86 65 - 99 mg/dL   BUN 13 6 - 24 mg/dL   Creatinine, Ser 6.23 0.76 - 1.27 mg/dL   GFR calc non Af Amer 109 >59 mL/min/1.73   GFR calc Af Amer 126 >59 mL/min/1.73   BUN/Creatinine Ratio 17 9 - 20   Sodium 141 134 - 144 mmol/L   Potassium 4.5 3.5 - 5.2 mmol/L   Chloride 104 96 - 106 mmol/L   CO2 24 20 - 29 mmol/L   Calcium 9.0 8.7 - 10.2 mg/dL   Total Protein 7.1  6.0 - 8.5 g/dL   Albumin 4.5 4.0 - 5.0 g/dL   Globulin, Total 2.6 1.5 - 4.5 g/dL   Albumin/Globulin Ratio 1.7 1.2 - 2.2   Bilirubin Total 0.5 0.0 - 1.2 mg/dL   Alkaline Phosphatase 79 48 - 121 IU/L   AST 17 0 - 40 IU/L   ALT 20 0 - 44 IU/L  Lipid panel  Result Value Ref Range   Cholesterol, Total 168 100 - 199 mg/dL   Triglycerides 762 (H) 0 - 149 mg/dL   HDL 43 >83 mg/dL   VLDL Cholesterol Cal 30 5 - 40 mg/dL   LDL Chol Calc (NIH) 95 0 - 99 mg/dL   Chol/HDL Ratio 3.9 0.0 - 5.0 ratio  TSH  Result Value Ref Range   TSH 3.880 0.450 - 4.500 uIU/mL  Hepatitis C antibody  Result Value Ref Range   Hep C Virus Ab <0.1 0.0 - 0.9 s/co ratio    Assessment & Plan    1. Annual physical exam Patient had a colonoscopy in 2015 with [facility]. Per office note this was normal. Will request records.   - CBC with Differential/Platelet - Comprehensive metabolic panel - Lipid panel - TSH  2. Essential hypertension Well controlled Continue current medications Recheck metabolic panel F/u in 12 months  - amLODipine (NORVASC) 5 MG tablet; Take 1 tablet (5 mg  total) by mouth daily.  Dispense: 90 tablet; Refill: 3  3. Need for hepatitis C screening test - Hepatitis C antibody  Routine Health Maintenance and Physical Exam  Exercise Activities and Dietary recommendations Goals   None      There is no immunization history on file for this patient.  Health Maintenance  Topic Date Due  . COVID-19 Vaccine (1) Never done  . TETANUS/TDAP  Never done  . INFLUENZA VACCINE  04/24/2020  . Hepatitis C Screening  Completed  . HIV Screening  Completed    Discussed health benefits of physical activity, and encouraged him to engage in regular exercise appropriate for his age and condition.    Return in about 1 year (around 03/24/2021) for CPE and HTN .     ITrey Sailors, PA-C, have reviewed all documentation for this visit. The documentation on 03/25/20 for the exam, diagnosis,  procedures, and orders are all accurate and complete.    Maryella Shivers  [facility] [phone] (phone) [phone] (fax)  [facility]

## 2020-03-24 ENCOUNTER — Other Ambulatory Visit: Payer: Self-pay

## 2020-03-24 ENCOUNTER — Ambulatory Visit (INDEPENDENT_AMBULATORY_CARE_PROVIDER_SITE_OTHER): Payer: Managed Care, Other (non HMO) | Admitting: Physician Assistant

## 2020-03-24 ENCOUNTER — Encounter: Payer: Self-pay | Admitting: Physician Assistant

## 2020-03-24 VITALS — BP 136/86 | HR 77 | Temp 98.1°F | Ht 69.0 in | Wt 166.8 lb

## 2020-03-24 DIAGNOSIS — I10 Essential (primary) hypertension: Secondary | ICD-10-CM

## 2020-03-24 DIAGNOSIS — Z0000 Encounter for general adult medical examination without abnormal findings: Secondary | ICD-10-CM | POA: Diagnosis not present

## 2020-03-24 DIAGNOSIS — Z1159 Encounter for screening for other viral diseases: Secondary | ICD-10-CM

## 2020-03-24 MED ORDER — AMLODIPINE BESYLATE 5 MG PO TABS: 5.0000 mg | ORAL_TABLET | Freq: Every day | ORAL | 3 refills | Status: DC

## 2020-03-25 ENCOUNTER — Telehealth: Payer: Self-pay | Admitting: Physician Assistant

## 2020-03-25 ENCOUNTER — Telehealth: Payer: Self-pay

## 2020-03-25 LAB — CBC WITH DIFFERENTIAL/PLATELET
Basophils Absolute: 0 10*3/uL (ref 0.0–0.2)
Basos: 1 %
EOS (ABSOLUTE): 0.1 10*3/uL (ref 0.0–0.4)
Eos: 1 %
Hematocrit: 42.7 % (ref 37.5–51.0)
Hemoglobin: 14.7 g/dL (ref 13.0–17.7)
Immature Grans (Abs): 0 10*3/uL (ref 0.0–0.1)
Immature Granulocytes: 0 %
Lymphocytes Absolute: 1.1 10*3/uL (ref 0.7–3.1)
Lymphs: 23 %
MCH: 30.1 pg (ref 26.6–33.0)
MCHC: 34.4 g/dL (ref 31.5–35.7)
MCV: 88 fL (ref 79–97)
Monocytes Absolute: 0.5 10*3/uL (ref 0.1–0.9)
Monocytes: 10 %
Neutrophils Absolute: 3.2 10*3/uL (ref 1.4–7.0)
Neutrophils: 65 %
Platelets: 234 10*3/uL (ref 150–450)
RBC: 4.88 x10E6/uL (ref 4.14–5.80)
RDW: 12.9 % (ref 11.6–15.4)
WBC: 4.9 10*3/uL (ref 3.4–10.8)

## 2020-03-25 LAB — LIPID PANEL
Chol/HDL Ratio: 3.9 ratio (ref 0.0–5.0)
Cholesterol, Total: 168 mg/dL (ref 100–199)
HDL: 43 mg/dL (ref >39)
LDL Chol Calc (NIH): 95 mg/dL (ref 0–99)
Triglycerides: 176 mg/dL — ABNORMAL HIGH (ref 0–149)
VLDL Cholesterol Cal: 30 mg/dL (ref 5–40)

## 2020-03-25 LAB — COMPREHENSIVE METABOLIC PANEL
ALT: 20 IU/L (ref 0–44)
AST: 17 IU/L (ref 0–40)
Albumin/Globulin Ratio: 1.7 (ref 1.2–2.2)
Albumin: 4.5 g/dL (ref 4.0–5.0)
Alkaline Phosphatase: 79 IU/L (ref 48–121)
BUN/Creatinine Ratio: 17 (ref 9–20)
BUN: 13 mg/dL (ref 6–24)
Bilirubin Total: 0.5 mg/dL (ref 0.0–1.2)
CO2: 24 mmol/L (ref 20–29)
Calcium: 9 mg/dL (ref 8.7–10.2)
Chloride: 104 mmol/L (ref 96–106)
Creatinine, Ser: 0.77 mg/dL (ref 0.76–1.27)
GFR calc Af Amer: 126 mL/min/{1.73_m2} (ref >59)
GFR calc non Af Amer: 109 mL/min/{1.73_m2} (ref >59)
Globulin, Total: 2.6 g/dL (ref 1.5–4.5)
Glucose: 86 mg/dL (ref 65–99)
Potassium: 4.5 mmol/L (ref 3.5–5.2)
Sodium: 141 mmol/L (ref 134–144)
Total Protein: 7.1 g/dL (ref 6.0–8.5)

## 2020-03-25 LAB — TSH: TSH: 3.88 u[IU]/mL (ref 0.450–4.500)

## 2020-03-25 LAB — HEPATITIS C ANTIBODY: Hep C Virus Ab: <0.1 s/co ratio (ref 0.0–0.9)

## 2020-03-25 NOTE — Telephone Encounter (Signed)
Patient was advised.  

## 2020-03-25 NOTE — Telephone Encounter (Signed)
-----   Message from Trey Sailors, [place] sent at 03/25/2020  8:57 AM EDT ----- Labs are all stable.

## 2020-03-25 NOTE — Telephone Encounter (Signed)
CPE form signed and placed for fax.

## 2020-03-25 NOTE — Patient Instructions (Signed)

## 2020-08-08 ENCOUNTER — Other Ambulatory Visit: Payer: Self-pay | Admitting: Physician Assistant

## 2020-08-08 DIAGNOSIS — I10 Essential (primary) hypertension: Secondary | ICD-10-CM

## 2020-08-08 MED ORDER — AMLODIPINE BESYLATE 5 MG PO TABS: 5.0000 mg | ORAL_TABLET | Freq: Every day | ORAL | 0 refills | Status: DC

## 2020-08-08 NOTE — Telephone Encounter (Signed)
Medication Refill - Medication: amLODipine (NORVASC) 5 MG tablet     Preferred Pharmacy (with phone number or street name):  [facility] - [place], [place] - [address] Phone:  [phone]  Fax:  [phone]       Agent: Please be advised that RX refills may take up to 3 business days. We ask that you follow-up with your pharmacy.

## 2020-08-08 NOTE — Telephone Encounter (Signed)
Called patient to schedule 6 month appt to recheck B/P, no answer left message to call back to clinic.

## 2020-11-14 ENCOUNTER — Encounter: Payer: Self-pay | Admitting: Emergency Medicine

## 2020-11-14 ENCOUNTER — Emergency Department: Payer: Managed Care, Other (non HMO)

## 2020-11-14 ENCOUNTER — Other Ambulatory Visit: Payer: Self-pay

## 2020-11-14 ENCOUNTER — Emergency Department
Admission: EM | Admit: 2020-11-14 | Discharge: 2020-11-14 | Disposition: A | Discharge status: Home / Self Care | Payer: Managed Care, Other (non HMO) | Attending: Student in an Organized Health Care Education/Training Program | Admitting: Student in an Organized Health Care Education/Training Program

## 2020-11-14 DIAGNOSIS — R0600 Dyspnea, unspecified: Secondary | ICD-10-CM | POA: Diagnosis not present

## 2020-11-14 DIAGNOSIS — I10 Essential (primary) hypertension: Secondary | ICD-10-CM | POA: Diagnosis not present

## 2020-11-14 DIAGNOSIS — R0602 Shortness of breath: Secondary | ICD-10-CM | POA: Diagnosis present

## 2020-11-14 DIAGNOSIS — R000 Tachycardia, unspecified: Secondary | ICD-10-CM | POA: Insufficient documentation

## 2020-11-14 DIAGNOSIS — Z79899 Other long term (current) drug therapy: Secondary | ICD-10-CM | POA: Insufficient documentation

## 2020-11-14 LAB — BASIC METABOLIC PANEL
Anion gap: 7 (ref 5–15)
BUN: 15 mg/dL (ref 6–20)
CO2: 27 mmol/L (ref 22–32)
Calcium: 9.1 mg/dL (ref 8.9–10.3)
Chloride: 102 mmol/L (ref 98–111)
Creatinine, Ser: 0.81 mg/dL (ref 0.61–1.24)
GFR, Estimated: >60 mL/min (ref >60)
Glucose, Bld: 107 mg/dL — ABNORMAL HIGH (ref 70–99)
Potassium: 3.3 mmol/L — ABNORMAL LOW (ref 3.5–5.1)
Sodium: 136 mmol/L (ref 135–145)

## 2020-11-14 LAB — CBC
HCT: 39.6 % (ref 39.0–52.0)
Hemoglobin: 13.6 g/dL (ref 13.0–17.0)
MCH: 29.5 pg (ref 26.0–34.0)
MCHC: 34.3 g/dL (ref 30.0–36.0)
MCV: 85.9 fL (ref 80.0–100.0)
Platelets: 324 10*3/uL (ref 150–400)
RBC: 4.61 MIL/uL (ref 4.22–5.81)
RDW: 12.5 % (ref 11.5–15.5)
WBC: 8.2 10*3/uL (ref 4.0–10.5)
nRBC: 0 % (ref 0.0–0.2)

## 2020-11-14 LAB — TROPONIN I (HIGH SENSITIVITY)
Troponin I (High Sensitivity): 4 ng/L (ref <18)
Troponin I (High Sensitivity): 4 ng/L (ref <18)

## 2020-11-14 MED ORDER — IOHEXOL 350 MG/ML SOLN
75.0000 mL | Freq: Once | INTRAVENOUS | Status: AC | PRN
  Administered 2020-11-14: 75 mL via INTRAVENOUS
  Filled 2020-11-14: qty 75

## 2020-11-14 MED ORDER — SODIUM CHLORIDE 0.9 % IV BOLUS
500.0000 mL | Freq: Once | INTRAVENOUS | Status: AC
  Administered 2020-11-14: 500 mL via INTRAVENOUS

## 2020-11-14 MED ORDER — IPRATROPIUM-ALBUTEROL 0.5-2.5 (3) MG/3ML IN SOLN
3.0000 mL | Freq: Once | RESPIRATORY_TRACT | Status: AC
  Administered 2020-11-14: 3 mL via RESPIRATORY_TRACT
  Filled 2020-11-14: qty 3

## 2020-11-14 NOTE — ED Notes (Signed)
Respirations even and unlabored. NADN.

## 2020-11-14 NOTE — ED Notes (Signed)
Patient ambulated around nurses station (about 50 yards), with oxygen saturation remaining above 99%. Dr. Roxan Hockey notified. Patient reports some shortness of breath, but reports improvement from previous.

## 2020-11-14 NOTE — Discharge Instructions (Addendum)
Please follow up with Cardiology

## 2020-11-14 NOTE — ED Notes (Signed)
Patient is resting comfortably. 

## 2020-11-14 NOTE — ED Notes (Signed)
Patient updated on POC.

## 2020-11-14 NOTE — ED Provider Notes (Addendum)
[facility] Emergency Department Provider Note    Event Date/Time   First MD Initiated Contact with Patient 11/14/20 1639     (approximate)  I have reviewed the triage vital signs and the nursing notes.   HISTORY  Chief Complaint Chest Pain    HPI [name] is a 45 y.o. male below listed past medical history presents to the ER for evaluation of shortness of breath.  Not complain of any specific chest pain.  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  States that he was tachycardic to 120s but not hypoxic.  He denies any history of asthma or bronchitis.  No fevers or chills.  No nausea or vomiting. Does not smoke.    Past Medical History:  Diagnosis Date  . Hypertension   . WPW (Wolff-Parkinson-White syndrome)    Family History  Problem Relation Age of Onset  . Healthy Mother   . Anemia Father    Past Surgical History:  Procedure Laterality Date  . COLONOSCOPY  03-10-12   Dr Bluford Kaufmann   Patient Active Problem List   Diagnosis Date Noted  . Migraine 10/17/2016  . History of migraine 10/17/2016  . WPW (Wolff-Parkinson-White syndrome) 10/17/2016  . Diverticulitis 10/17/2016  . Patellofemoral stress syndrome of left knee 12/28/2014  . Abdominal pain, chronic, right lower quadrant 04/28/2013  . Benign localized hyperplasia of prostate with urinary obstruction 09/29/2012      Prior to Admission medications   Medication Sig Start Date End Date Taking? Authorizing Provider  amLODipine (NORVASC) 5 MG tablet Take 1 tablet (5 mg total) by mouth daily. 08/08/20   Trey Sailors, PA-C    Allergies Camphor-phenol, Caramel, Chocolate flavor, Dextromethorphan-guaifenesin, Robitussin (alcohol free) [guaifenesin], and Tape    Social  History Social History   Tobacco Use  . Smoking status: Never Smoker  . Smokeless tobacco: Never Used  Vaping Use  . Vaping Use: Never used  Substance Use Topics  . Alcohol use: No  . Drug use: No    Review of Systems Patient denies headaches, rhinorrhea, blurry vision, numbness, shortness of breath, chest pain, edema, cough, abdominal pain, nausea, vomiting, diarrhea, dysuria, fevers, rashes or hallucinations unless otherwise stated above in HPI. ____________________________________________   PHYSICAL EXAM:  VITAL SIGNS: Vitals:   11/14/20 1655 11/14/20 1756  BP: (!) 136/100 (!) 140/95  Pulse: 88 (!) 104  Resp: 18 18  Temp:    SpO2: 99% 100%    Constitutional: Alert and oriented.  Eyes: Conjunctivae are normal.  Head: Atraumatic. Nose: No congestion/rhinnorhea. Mouth/Throat: Mucous membranes are moist.   Neck: No stridor. Painless ROM.  Cardiovascular: Normal rate, regular rhythm. Grossly normal heart sounds.  Good peripheral circulation. Respiratory: Normal respiratory effort.  No retractions. Lungs CTAB. Gastrointestinal: Soft and nontender. No distention. No abdominal bruits. No CVA tenderness. Genitourinary:  Musculoskeletal: No lower extremity tenderness nor edema.  No joint effusions. Neurologic:  Normal speech and language. No gross focal neurologic deficits are appreciated. No facial droop Skin:  Skin is warm, dry and intact. No rash noted. Psychiatric: appears slightly anxious. Speech and behavior are normal.  ____________________________________________   LABS (all labs ordered are listed, but only abnormal results are displayed)  Results for orders placed or performed during the hospital encounter of 11/14/20 (from the past 24  hour(s))  Basic metabolic panel     Status: Abnormal   Collection Time: 11/14/20  3:17 PM  Result Value Ref Range   Sodium 136 135 - 145 mmol/L   Potassium 3.3 (L) 3.5 - 5.1 mmol/L   Chloride 102 98 - 111 mmol/L   CO2 27 22 -  32 mmol/L   Glucose, Bld 107 (H) 70 - 99 mg/dL   BUN 15 6 - 20 mg/dL   Creatinine, Ser 3.01 0.61 - 1.24 mg/dL   Calcium 9.1 8.9 - 60.1 mg/dL   GFR, Estimated >09 >32 mL/min   Anion gap 7 5 - 15  CBC     Status: None   Collection Time: 11/14/20  3:17 PM  Result Value Ref Range   WBC 8.2 4.0 - 10.5 K/uL   RBC 4.61 4.22 - 5.81 MIL/uL   Hemoglobin 13.6 13.0 - 17.0 g/dL   HCT 35.5 73.2 - 20.2 %   MCV 85.9 80.0 - 100.0 fL   MCH 29.5 26.0 - 34.0 pg   MCHC 34.3 30.0 - 36.0 g/dL   RDW 54.2 70.6 - 23.7 %   Platelets 324 150 - 400 K/uL   nRBC 0.0 0.0 - 0.2 %  Troponin I (High Sensitivity)     Status: None   Collection Time: 11/14/20  3:17 PM  Result Value Ref Range   Troponin I (High Sensitivity) 4 <18 ng/L  Troponin I (High Sensitivity)     Status: None   Collection Time: 11/14/20  5:05 PM  Result Value Ref Range   Troponin I (High Sensitivity) 4 <18 ng/L   ____________________________________________  EKG My review and personal interpretation at Time: 15:04   Indication: sob  Rate: 95  Rhythm: sinus Axis: normal Other: normal intervals, no stemi ____________________________________________  RADIOLOGY  I personally reviewed all radiographic images ordered to evaluate for the above acute complaints and reviewed radiology reports and findings.  These findings were personally discussed with the patient.  Please see medical record for radiology report.  ____________________________________________   PROCEDURES  Procedure(s) performed:  Procedures    Critical Care performed: no ____________________________________________   INITIAL IMPRESSION / ASSESSMENT AND PLAN / ED COURSE  Pertinent labs & imaging results that were available during my care of the patient were reviewed by me and considered in my medical decision making (see chart for details).   DDX: Asthma, copd, CHF, pna, ptx, malignancy, Pe, anemia   [name] is a 45 y.o. who presents to the ED with  symptoms of dyspnea as described above.  Is not complaining of any chest pain pressure at this time.  Described palpitations and sob.  His EKG is nonischemic.  Initial troponin negative his heart score of 2 but given his presentation will order serial enzymes.  As he is recently post Covid illness will trial nebulizer.  We will also order CTA to exclude PE.  No sign of pneumothorax.  No anemia.  Clinical Course as of 11/14/20 1856  Mon Nov 14, 2020  1850 Patient reassessed.  Remains in no acute distress.  Does appear somewhat anxious but declining any medication.  Did not feel any improvement after nebulizer.  I doubt bronchitis or COPD.  Cardiac enzymes are negative.  Is not complaining of any chest pain right now.  Does not seem consistent with ACS.  Reportedly had cardiac stress few years ago which was low risk.  Given his reassuring work-up, at this point I do believe he stable and appropriate for  outpatient follow-up.  [PR]    Clinical Course User Index [PR] Willy Eddy, MD    The patient was evaluated in Emergency Department today for the symptoms described in the history of present illness. He/she was evaluated in the context of the global COVID-19 pandemic, which necessitated consideration that the patient might be at risk for infection with the SARS-CoV-2 virus that causes COVID-19. Institutional protocols and algorithms that pertain to the evaluation of patients at risk for COVID-19 are in a state of rapid change based on information released by regulatory bodies including the CDC and federal and state organizations. These policies and algorithms were followed during the patient's care in the ED.  As part of my medical decision making, I reviewed the following data within the electronic MEDICAL RECORD NUMBER Nursing notes reviewed and incorporated, Labs reviewed, notes from prior ED visits and [place] Controlled Substance Database   ____________________________________________   FINAL  CLINICAL IMPRESSION(S) / ED DIAGNOSES  Final diagnoses:  Dyspnea, unspecified type      NEW MEDICATIONS STARTED DURING THIS VISIT:  New Prescriptions   No medications on file     Note:  This document was prepared using Dragon voice recognition software and may include unintentional dictation errors.        Willy Eddy, MD 11/14/20 [phone]

## 2020-11-14 NOTE — ED Notes (Signed)
Patient transported to CT 

## 2020-11-14 NOTE — ED Notes (Signed)
Patient ambulatory to restroom. Patient noted to have exertional dyspnea and tachypnea on arrival back to room from restroom. Patient oxygen saturation noted to be 99% on RA, but patient continues to report SOB. Patient encouraged to take deep breaths, improvement noted to tachypnea.

## 2020-11-14 NOTE — ED Triage Notes (Signed)
Pt to ed with c/o chest pain, tachycardia and SOB x 2 hours. Pt had COVID about 1 month ago. EKG done on arrival.

## 2020-11-15 ENCOUNTER — Telehealth: Payer: Self-pay

## 2020-11-15 NOTE — Telephone Encounter (Signed)
Transition Care Management Follow-up Telephone Call  Date of discharge and from where: 11/14/2020 from [facility]  How have you been since you were released from the hospital? Pt stated that he feels okay just very tired.   Any questions or concerns? No  Items Reviewed:  Did the pt receive and understand the discharge instructions provided? Yes   Medications obtained and verified? Yes   Other? No   Any new allergies since your discharge? No   Dietary orders reviewed? Heart Healthy  Do you have support at home? Yes   Functional Questionnaire: (I = Independent and D = Dependent) ADLs: I  Bathing/Dressing- I  Meal Prep- I  Eating- I  Maintaining continence- I  Transferring/Ambulation- I  Managing Meds- I   Follow up appointments reviewed:   PCP Hospital f/u appt confirmed? No  Pt will call today for an appointment with PCP.   Specialist Hospital f/u appt confirmed? No  Pt will call today for an appointment with cardiology.   Are transportation arrangements needed? No  If their condition worsens, is the pt aware to call PCP or go to the Emergency Dept.? Yes Was the patient provided with contact information for the PCP's office or ED? Yes Was to pt encouraged to call back with questions or concerns? Yes

## 2020-11-22 NOTE — Progress Notes (Signed)
Established patient visit   Patient: Bryan Coleman   DOB: [birth year]   45 y.o. Male  MRN: [id number] Visit Date: 11/23/2020  Today's healthcare provider: Trey Sailors, PA-C   Chief Complaint  Patient presents with  . Follow-up    ER  I,Cabot Cromartie M Alica Shellhammer,acting as a scribe for Trey Sailors, PA-C.,have documented all relevant documentation on the behalf of Trey Sailors, PA-C,as directed by  Trey Sailors, PA-C while in the presence of Trey Sailors, PA-C.  Subjective    HPI HPI    Follow-up    Comments: ER       Last edited by Margo Common, CMA on 11/23/2020  8:37 AM. (History)      Follow up ER visit  Patient was seen in ER for chest pain on 11/14/2020.  Patient was initially diagnosed with covid at the end of January. Patient had some progressive shortness of breath which lead him to the ER visit. EKG was negative for ischemic changes. CTA was negative. Troponins negative. Followed up with Dr. Welton Flakes two days ago and was started on losartan and metorpolol. Echo scheduled for next Friday and he is also to have a holter monitor.   He was treated for chest pain. Treatment for this included chest x-ray, EKG,labs. He reports satisfactory compliance with treatment. He reports this condition is Improved.  Pulse Readings from Last 3 Encounters:  11/23/20 70  11/14/20 95  03/24/20 77    He ran out of his amlodipine. He is unsure if Dr. Welton Flakes wanted him to continue this.   He reports some ongoing shortness of breath.  -----------------------------------------------------------------------------------------       Medications: Outpatient Medications Prior to Visit  Medication Sig  . losartan (COZAAR) 25 MG tablet Take 25 mg by mouth daily.  . metoprolol succinate (TOPROL-XL) 25 MG 24 hr tablet Take 25 mg by mouth daily.  . [DISCONTINUED] amLODipine (NORVASC) 5 MG tablet Take 1 tablet (5 mg total) by mouth daily.   No facility-administered  medications prior to visit.    Review of Systems  Constitutional: Negative.   Respiratory: Negative.   Hematological: Negative.        Objective    BP (!) 131/101 (BP Location: Left Arm, Patient Position: Sitting, Cuff Size: Normal)   Pulse 70   Temp 98.2 F (36.8 C) (Oral)   Wt 168 lb 8 oz (76.4 kg)   SpO2 100%   BMI 24.88 kg/m     Physical Exam Constitutional:      Appearance: Normal appearance. He is normal weight.  Cardiovascular:     Rate and Rhythm: Normal rate and regular rhythm.     Heart sounds: Normal heart sounds.  Pulmonary:     Effort: Pulmonary effort is normal.     Breath sounds: Normal breath sounds. No wheezing.  Skin:    General: Skin is warm and dry.  Neurological:     General: No focal deficit present.     Mental Status: He is alert and oriented to person, place, and time.  Psychiatric:        Mood and Affect: Mood normal.        Behavior: Behavior normal.       No results found for any visits on 11/23/20.  Assessment & Plan    1. Essential hypertension  HTN uncontrolled. Refilled amlodipine, counseled on monitoring blood pressure. He is following up with Dr. Welton Flakes in the coming weeks for echo  and holter monitor.   - amLODipine (NORVASC) 5 MG tablet; Take 1 tablet (5 mg total) by mouth daily.  Dispense: 90 tablet; Refill: 0  2. Shortness of breath  CTA negative for acute process, may be related to COVID. Offered inhaler, either short or long term. Patient would like to observe but knows he can call back if he changes his mind.    Return if symptoms worsen or fail to improve.      ITrey Sailors, PA-C, have reviewed all documentation for this visit. The documentation on 11/23/20 for the exam, diagnosis, procedures, and orders are all accurate and complete.  The entirety of the information documented in the History of Present Illness, Review of Systems and Physical Exam were personally obtained by me. Portions of this information  were initially documented by [facility] and reviewed by me for thoroughness and accuracy.     Maryella Shivers  [facility] [phone] (phone) [phone] (fax)  [facility]

## 2020-11-23 ENCOUNTER — Other Ambulatory Visit: Payer: Self-pay

## 2020-11-23 ENCOUNTER — Encounter: Payer: Self-pay | Admitting: Physician Assistant

## 2020-11-23 ENCOUNTER — Ambulatory Visit: Payer: Managed Care, Other (non HMO) | Admitting: Physician Assistant

## 2020-11-23 VITALS — BP 131/101 | HR 70 | Temp 98.2°F | Wt 168.5 lb

## 2020-11-23 DIAGNOSIS — I10 Essential (primary) hypertension: Secondary | ICD-10-CM

## 2020-11-23 DIAGNOSIS — R0602 Shortness of breath: Secondary | ICD-10-CM

## 2020-11-23 MED ORDER — AMLODIPINE BESYLATE 5 MG PO TABS: 5.0000 mg | ORAL_TABLET | Freq: Every day | ORAL | 0 refills | Status: DC

## 2020-11-23 NOTE — Patient Instructions (Signed)
Shortness of Breath, Adult Shortness of breath means you have trouble breathing. Shortness of breath could be a sign of a medical problem. Follow these instructions at home:  Watch for any changes in your symptoms.  Do not use any products that contain nicotine or tobacco, such as cigarettes, e-cigarettes, and chewing tobacco.  Do not smoke. Smoking can cause shortness of breath. If you need help to quit smoking, ask your doctor.  Avoid things that can make it harder to breathe, such as: ? Mold. ? Dust. ? Air pollution. ? Chemical smells. ? Things that can cause allergy symptoms (allergens), if you have allergies.  Keep your living space clean. Use products that help remove mold and dust.  Rest as needed. Slowly return to your normal activities.  Take over-the-counter and prescription medicines only as told by your doctor. This includes oxygen therapy and inhaled medicines.  Keep all follow-up visits as told by your doctor. This is important.   Contact a doctor if:  Your condition does not get better as soon as expected.  You have a hard time doing your normal activities, even after you rest.  You have new symptoms. Get help right away if:  Your shortness of breath gets worse.  You have trouble breathing when you are resting.  You feel light-headed or you pass out (faint).  You have a cough that is not helped by medicines.  You cough up blood.  You have pain with breathing.  You have pain in your chest, arms, shoulders, or belly (abdomen).  You have a fever.  You cannot walk up stairs.  You cannot exercise the way you normally do. These symptoms may represent a serious problem that is an emergency. Do not wait to see if the symptoms will go away. Get medical help right away. Call your local emergency services (911 in the [place] Do not drive yourself to the hospital. Summary  Shortness of breath is when you have trouble breathing enough air. It can be a sign of a  medical problem.  Avoid things that make it hard for you to breathe, such as smoking, pollution, mold, and dust.  Watch for any changes in your symptoms. Contact your doctor if you do not get better or you get worse. This information is not intended to replace advice given to you by your health care provider. Make sure you discuss any questions you have with your health care provider. Document Revised: 02/10/2018 Document Reviewed: 02/10/2018 Elsevier Patient Education  2021 Elsevier Inc.  

## 2021-03-28 ENCOUNTER — Encounter: Payer: Self-pay | Admitting: Physician Assistant

## 2021-03-28 ENCOUNTER — Encounter: Payer: Managed Care, Other (non HMO) | Admitting: Family Medicine

## 2021-04-07 ENCOUNTER — Other Ambulatory Visit: Payer: Self-pay

## 2021-04-07 ENCOUNTER — Ambulatory Visit (INDEPENDENT_AMBULATORY_CARE_PROVIDER_SITE_OTHER): Payer: Managed Care, Other (non HMO) | Admitting: Family Medicine

## 2021-04-07 ENCOUNTER — Encounter: Payer: Self-pay | Admitting: Family Medicine

## 2021-04-07 VITALS — BP 131/87 | HR 57 | Ht 69.0 in | Wt 168.0 lb

## 2021-04-07 DIAGNOSIS — E781 Pure hyperglyceridemia: Secondary | ICD-10-CM

## 2021-04-07 DIAGNOSIS — I10 Essential (primary) hypertension: Secondary | ICD-10-CM | POA: Diagnosis not present

## 2021-04-07 DIAGNOSIS — N401 Enlarged prostate with lower urinary tract symptoms: Secondary | ICD-10-CM | POA: Diagnosis not present

## 2021-04-07 DIAGNOSIS — Z0000 Encounter for general adult medical examination without abnormal findings: Secondary | ICD-10-CM | POA: Diagnosis not present

## 2021-04-07 DIAGNOSIS — N138 Other obstructive and reflux uropathy: Secondary | ICD-10-CM

## 2021-04-07 NOTE — Patient Instructions (Signed)
Preventive Care 40-47 Years Old, Male Preventive care refers to lifestyle choices and visits with your health care provider that can promote health and wellness. This includes: A yearly physical exam. This is also called an annual wellness visit. Regular dental and eye exams. Immunizations. Screening for certain conditions. Healthy lifestyle choices, such as: Eating a healthy diet. Getting regular exercise. Not using drugs or products that contain nicotine and tobacco. Limiting alcohol use. What can I expect for my preventive care visit? Physical exam Your health care provider will check your: Height and weight. These may be used to calculate your BMI (body mass index). BMI is a measurement that tells if you are at a healthy weight. Heart rate and blood pressure. Body temperature. Skin for abnormal spots. Counseling Your health care provider may ask you questions about your: Past medical problems. Family's medical history. Alcohol, tobacco, and drug use. Emotional well-being. Home life and relationship well-being. Sexual activity. Diet, exercise, and sleep habits. Work and work environment. Access to firearms. What immunizations do I need?  Vaccines are usually given at various ages, according to a schedule. Your health care provider will recommend vaccines for you based on your age, medicalhistory, and lifestyle or other factors, such as travel or where you work. What tests do I need? Blood tests Lipid and cholesterol levels. These may be checked every 5 years, or more often if you are over 50 years old. Hepatitis C test. Hepatitis B test. Screening Lung cancer screening. You may have this screening every year starting at age 55 if you have a 30-pack-year history of smoking and currently smoke or have quit within the past 15 years. Prostate cancer screening. Recommendations will vary depending on your family history and other risks. Genital exam to check for testicular cancer  or hernias. Colorectal cancer screening. All adults should have this screening starting at age 50 and continuing until age 75. Your health care provider may recommend screening at age 45 if you are at increased risk. You will have tests every 1-10 years, depending on your results and the type of screening test. Diabetes screening. This is done by checking your blood sugar (glucose) after you have not eaten for a while (fasting). You may have this done every 1-3 years. STD (sexually transmitted disease) testing, if you are at risk. Follow these instructions at home: Eating and drinking  Eat a diet that includes fresh fruits and vegetables, whole grains, lean protein, and low-fat dairy products. Take vitamin and mineral supplements as recommended by your health care provider. Do not drink alcohol if your health care provider tells you not to drink. If you drink alcohol: Limit how much you have to 0-2 drinks a day. Be aware of how much alcohol is in your drink. In the [place] one drink equals one 12 oz bottle of beer (355 mL), one 5 oz glass of wine (148 mL), or one 1 oz glass of hard liquor (44 mL).  Lifestyle Take daily care of your teeth and gums. Brush your teeth every morning and night with fluoride toothpaste. Floss one time each day. Stay active. Exercise for at least 30 minutes 5 or more days each week. Do not use any products that contain nicotine or tobacco, such as cigarettes, e-cigarettes, and chewing tobacco. If you need help quitting, ask your health care provider. Do not use drugs. If you are sexually active, practice safe sex. Use a condom or other form of protection to prevent STIs (sexually transmitted infections). If told by   your health care provider, take low-dose aspirin daily starting at age 50. Find healthy ways to cope with stress, such as: Meditation, yoga, or listening to music. Journaling. Talking to a trusted person. Spending time with friends and  family. Safety Always wear your seat belt while driving or riding in a vehicle. Do not drive: If you have been drinking alcohol. Do not ride with someone who has been drinking. When you are tired or distracted. While texting. Wear a helmet and other protective equipment during sports activities. If you have firearms in your house, make sure you follow all gun safety procedures. What's next? Go to your health care provider once a year for an annual wellness visit. Ask your health care provider how often you should have your eyes and teeth checked. Stay up to date on all vaccines. This information is not intended to replace advice given to you by your health care provider. Make sure you discuss any questions you have with your healthcare provider. Document Revised: 06/09/2019 Document Reviewed: 09/04/2018 Elsevier Patient Education  2022 Elsevier Inc.  

## 2021-04-07 NOTE — Progress Notes (Signed)
Complete physical exam   Patient: Bryan Coleman   DOB: [birth year]   45 y.o. Male  MRN: [id number] Visit Date: 04/07/2021  Today's healthcare provider: Dortha Kern, PA-C   No chief complaint on file.  Subjective    Bryan Coleman is a 45 y.o. male who presents today for a complete physical exam.  He reports consuming a general diet. The patient has a physically strenuous job, but has no regular exercise apart from work.  He generally feels well. He reports sleeping fairly well. He does not have additional problems to discuss today.    Past Medical History:  Diagnosis Date   Hypertension    WPW (Wolff-Parkinson-White syndrome)    Past Surgical History:  Procedure Laterality Date   COLONOSCOPY  03-10-12   Dr Bluford Kaufmann   Social History   Socioeconomic History   Marital status: Single    Spouse name: Not on file   Number of children: Not on file   Years of education: Not on file   Highest education level: Not on file  Occupational History   Not on file  Tobacco Use   Smoking status: Never   Smokeless tobacco: Never  Vaping Use   Vaping Use: Never used  Substance and Sexual Activity   Alcohol use: No   Drug use: No   Sexual activity: Not on file  Other Topics Concern   Not on file  Social History Narrative   Not on file   Social Determinants of Health   Financial Resource Strain: Not on file  Food Insecurity: Not on file  Transportation Needs: Not on file  Physical Activity: Not on file  Stress: Not on file  Social Connections: Not on file  Intimate Partner Violence: Not on file   Family Status  Relation Name Status   Mother  Alive   Father  Alive   Family History  Problem Relation Age of Onset   Healthy Mother    Anemia Father    Allergies  Allergen Reactions   Camphor-Phenol Rash    blister   Caramel Rash   Chocolate Flavor Rash   Dextromethorphan-Guaifenesin Rash   Robitussin (Alcohol Free) [Guaifenesin] Rash   Tape Rash    blister     Patient Care Team: Maryella Shivers as PCP - General (Physician Assistant) Kieth Brightly, MD (General Surgery) Sherron Monday, MD as Referring Physician (Internal Medicine)   Medications: Outpatient Medications Prior to Visit  Medication Sig   amLODipine (NORVASC) 5 MG tablet Take 1 tablet (5 mg total) by mouth daily.   losartan (COZAAR) 25 MG tablet Take 25 mg by mouth daily.   metoprolol succinate (TOPROL-XL) 25 MG 24 hr tablet Take 25 mg by mouth daily.   No facility-administered medications prior to visit.    Review of Systems  Constitutional:  Positive for fatigue. Negative for activity change, appetite change, chills, diaphoresis, fever and unexpected weight change.  HENT:  Positive for rhinorrhea, sinus pressure, tinnitus and trouble swallowing. Negative for congestion, dental problem, drooling, ear discharge, ear pain, facial swelling, hearing loss, mouth sores, nosebleeds, postnasal drip, sinus pain, sneezing, sore throat and voice change.   Eyes: Negative.   Respiratory: Negative.    Cardiovascular: Negative.   Gastrointestinal:  Positive for abdominal pain and constipation. Negative for abdominal distention, anal bleeding, blood in stool, diarrhea, nausea, rectal pain and vomiting.  Endocrine: Negative.   Genitourinary:  Positive for urgency. Negative for decreased urine volume, difficulty urinating,  dysuria, enuresis, flank pain, frequency, genital sores, hematuria, penile discharge, penile pain, penile swelling, scrotal swelling and testicular pain.  Musculoskeletal: Negative.   Skin: Negative.   Allergic/Immunologic: Positive for environmental allergies. Negative for food allergies and immunocompromised state.  Neurological:  Positive for headaches. Negative for dizziness, tremors, seizures, syncope, facial asymmetry, speech difficulty, weakness, light-headedness and numbness.  Psychiatric/Behavioral: Negative.      Objective    BP 131/87 (BP  Location: Right Arm, Patient Position: Sitting, Cuff Size: Large)   Pulse (!) 57   Ht 5\' 9"  (1.753 m)   Wt 168 lb (76.2 kg)   BMI 24.81 kg/m  Waist circumference 37 inches. Wt Readings from Last 3 Encounters:  04/07/21 168 lb (76.2 kg)  11/23/20 168 lb 8 oz (76.4 kg)  11/14/20 170 lb (77.1 kg)   BP Readings from Last 3 Encounters:  04/07/21 131/87  11/23/20 (!) 131/101  11/14/20 (!) 140/95   Physical Exam Constitutional:      Appearance: He is well-developed.  HENT:     Head: Normocephalic and atraumatic.     Right Ear: External ear normal.     Left Ear: External ear normal.     Nose: Nose normal.  Eyes:     General:        Right eye: No discharge.     Conjunctiva/sclera: Conjunctivae normal.     Pupils: Pupils are equal, round, and reactive to light.  Neck:     Thyroid: No thyromegaly.     Trachea: No tracheal deviation.  Cardiovascular:     Rate and Rhythm: Normal rate and regular rhythm.     Heart sounds: Normal heart sounds. No murmur heard. Pulmonary:     Effort: Pulmonary effort is normal. No respiratory distress.     Breath sounds: Normal breath sounds. No wheezing or rales.  Chest:     Chest wall: No tenderness.  Abdominal:     General: There is no distension.     Palpations: Abdomen is soft. There is no mass.     Tenderness: There is no abdominal tenderness. There is no guarding or rebound.  Genitourinary:    Penis: Normal.      Testes: Normal.     Prostate: Normal.     Rectum: Normal. Guaiac result negative.  Musculoskeletal:        General: No tenderness. Normal range of motion.     Cervical back: Normal range of motion and neck supple.  Lymphadenopathy:     Cervical: No cervical adenopathy.  Skin:    General: Skin is warm and dry.     Findings: No erythema or rash.  Neurological:     Mental Status: He is alert and oriented to person, place, and time.     Cranial Nerves: No cranial nerve deficit.     Motor: No abnormal muscle tone.      Coordination: Coordination normal.     Deep Tendon Reflexes: Reflexes are normal and symmetric. Reflexes normal.  Psychiatric:        Behavior: Behavior normal.        Thought Content: Thought content normal.        Judgment: Judgment normal.      Last depression screening scores PHQ 2/9 Scores 03/24/2020 02/06/2019  PHQ - 2 Score 0 0  PHQ- 9 Score - 0   Last fall risk screening Fall Risk  03/24/2020  Falls in the past year? 0  Number falls in past yr: 0  Injury with Fall?  0  Risk for fall due to : No Fall Risks  Follow up Falls evaluation completed   Last Audit-C alcohol use screening Alcohol Use Disorder Test (AUDIT) 03/24/2020  1. How often do you have a drink containing alcohol? 0  2. How many drinks containing alcohol do you have on a typical day when you are drinking? 0  3. How often do you have six or more drinks on one occasion? 0  AUDIT-C Score 0  Alcohol Brief Interventions/Follow-up AUDIT Score <7 follow-up not indicated   A score of 3 or more in women, and 4 or more in men indicates increased risk for alcohol abuse, EXCEPT if all of the points are from question 1   No results found for any visits on 04/07/21.  Assessment & Plan    Routine Health Maintenance and Physical Exam  Exercise Activities and Dietary recommendations  Goals   Continue physical activities at work and can walk 30-40 minutes 3-4 days a week for exercise. Increase water intake and recommend low fat diet.     Immunization History  Administered Date(s) Administered   [employer] Vaccination 01/19/2020, 02/16/2020    Health Maintenance  Topic Date Due   TETANUS/TDAP  Never done   COLONOSCOPY (Pts 45-18yrs Insurance coverage will need to be confirmed)  Never done   COVID-19 Vaccine (3 - Booster for Moderna series) 07/18/2020   INFLUENZA VACCINE  04/24/2021   Hepatitis C Screening  Completed   HIV Screening  Completed   Pneumococcal Vaccine 50-63 Years old  Aged Out   HPV VACCINES   Aged Out    Discussed health benefits of physical activity, and encouraged him to engage in regular exercise appropriate for his age and condition.  1. Annual physical exam Good general health. Counseled regarding health maintenance. Should check with pharmacy regarding Tdap. - CBC with Differential/Platelet - Comprehensive metabolic panel - Lipid panel - TSH - PSA  2. Essential hypertension Good control of BP without chest discomfort, palpitations or dyspnea. Continue Losartan, Amlodipine and Metoprolol. Recheck labs. - CBC with Differential/Platelet - Comprehensive metabolic panel - Lipid panel - TSH  3. Benign localized hyperplasia of prostate with urinary obstruction No further problems with urinary flow other than urgency occasionally. No hematuria, frequency, decreased stream or nocturia of significance. Will recheck labs. DRE without remarkable enlargement. - CBC with Differential/Platelet - Comprehensive metabolic panel - PSA  4. Hypertriglyceridemia History of elevated triglycerides in July of 2021. Recommend low fat diet and recheck labs. - CBC with Differential/Platelet - Comprehensive metabolic panel - Lipid panel - TSH    No follow-ups on file.     I, Corran Lalone, PA-C, have reviewed all documentation for this visit. The documentation on 04/07/21 for the exam, diagnosis, procedures, and orders are all accurate and complete.    Dortha Kern, PA-C  [facility] [phone] (phone) [phone] (fax)  [facility]

## 2021-04-08 LAB — COMPREHENSIVE METABOLIC PANEL
ALT: 14 IU/L (ref 0–44)
AST: 12 IU/L (ref 0–40)
Albumin/Globulin Ratio: 1.7 (ref 1.2–2.2)
Albumin: 4.5 g/dL (ref 4.0–5.0)
Alkaline Phosphatase: 73 IU/L (ref 44–121)
BUN/Creatinine Ratio: 13 (ref 9–20)
BUN: 11 mg/dL (ref 6–24)
Bilirubin Total: 0.8 mg/dL (ref 0.0–1.2)
CO2: 22 mmol/L (ref 20–29)
Calcium: 9.1 mg/dL (ref 8.7–10.2)
Chloride: 101 mmol/L (ref 96–106)
Creatinine, Ser: 0.85 mg/dL (ref 0.76–1.27)
Globulin, Total: 2.7 g/dL (ref 1.5–4.5)
Glucose: 86 mg/dL (ref 65–99)
Potassium: 4.4 mmol/L (ref 3.5–5.2)
Sodium: 140 mmol/L (ref 134–144)
Total Protein: 7.2 g/dL (ref 6.0–8.5)
eGFR: 108 mL/min/{1.73_m2} (ref >59)

## 2021-04-08 LAB — LIPID PANEL
Chol/HDL Ratio: 3.7 ratio (ref 0.0–5.0)
Cholesterol, Total: 153 mg/dL (ref 100–199)
HDL: 41 mg/dL (ref >39)
LDL Chol Calc (NIH): 90 mg/dL (ref 0–99)
Triglycerides: 122 mg/dL (ref 0–149)
VLDL Cholesterol Cal: 22 mg/dL (ref 5–40)

## 2021-04-08 LAB — CBC WITH DIFFERENTIAL/PLATELET
Basophils Absolute: 0 10*3/uL (ref 0.0–0.2)
Basos: 1 %
EOS (ABSOLUTE): 0.1 10*3/uL (ref 0.0–0.4)
Eos: 1 %
Hematocrit: 40.6 % (ref 37.5–51.0)
Hemoglobin: 14.2 g/dL (ref 13.0–17.7)
Immature Grans (Abs): 0 10*3/uL (ref 0.0–0.1)
Immature Granulocytes: 0 %
Lymphocytes Absolute: 1.2 10*3/uL (ref 0.7–3.1)
Lymphs: 19 %
MCH: 29.5 pg (ref 26.6–33.0)
MCHC: 35 g/dL (ref 31.5–35.7)
MCV: 84 fL (ref 79–97)
Monocytes Absolute: 0.5 10*3/uL (ref 0.1–0.9)
Monocytes: 8 %
Neutrophils Absolute: 4.6 10*3/uL (ref 1.4–7.0)
Neutrophils: 71 %
Platelets: 239 10*3/uL (ref 150–450)
RBC: 4.81 x10E6/uL (ref 4.14–5.80)
RDW: 12.8 % (ref 11.6–15.4)
WBC: 6.5 10*3/uL (ref 3.4–10.8)

## 2021-04-08 LAB — PSA: Prostate Specific Ag, Serum: 0.6 ng/mL (ref 0.0–4.0)

## 2021-04-08 LAB — TSH: TSH: 3 u[IU]/mL (ref 0.450–4.500)

## 2022-01-03 ENCOUNTER — Emergency Department
Admission: EM | Admit: 2022-01-03 | Discharge: 2022-01-04 | Disposition: A | Discharge status: Home / Self Care | Payer: Managed Care, Other (non HMO) | Attending: Emergency Medicine | Admitting: Emergency Medicine

## 2022-01-03 ENCOUNTER — Other Ambulatory Visit: Payer: Self-pay

## 2022-01-03 DIAGNOSIS — M5481 Occipital neuralgia: Secondary | ICD-10-CM | POA: Insufficient documentation

## 2022-01-03 DIAGNOSIS — H532 Diplopia: Secondary | ICD-10-CM | POA: Diagnosis present

## 2022-01-03 DIAGNOSIS — R519 Headache, unspecified: Secondary | ICD-10-CM

## 2022-01-03 NOTE — ED Triage Notes (Signed)
Pt reports having double vision without his glasses for the past year and a headache for the past 2 weeks. Pt reports that he felt nauseated prior to eating tonight.  ?

## 2022-01-04 ENCOUNTER — Emergency Department: Payer: Managed Care, Other (non HMO)

## 2022-01-04 MED ORDER — BUTALBITAL-APAP-CAFFEINE 50-325-40 MG PO TABS: 1.0000 | ORAL_TABLET | Freq: Four times a day (QID) | ORAL | 0 refills | Status: AC | PRN

## 2022-01-04 MED ORDER — BUTALBITAL-APAP-CAFFEINE 50-325-40 MG PO TABS
1.0000 | ORAL_TABLET | Freq: Once | ORAL | Status: AC
  Administered 2022-01-04: 1 via ORAL
  Filled 2022-01-04: qty 1

## 2022-01-04 NOTE — ED Provider Notes (Signed)
? ?[facility] ?Provider Note ? ? Event Date/Time  ? First MD Initiated Contact with Patient 01/03/22 2354   ?  (approximate) ?History  ?Headache and Diplopia ? ?HPI ?Bryan Coleman is a 46 y.o. male with a stated past medical history of migraines who presents for diplopia for the last year as well as an occipital headache for the last 2 weeks.  Patient states that his diplopia has been worked up by ophthalmology over the last year and told that there were no abnormalities however patient does complain that his "eyes go in different directions".  Patient denies ever having to wear a patch over 1 eye.  Patient also endorses 3/10 nonradiating occipital headache that has been present over the past 2 weeks as this diplopia has become more constant.  Patient denies any blurred vision, facial droop, eyelid droop, conjunctival injection, or foreign body sensation ?Physical Exam  ?Triage Vital Signs: ?ED Triage Vitals  ?Enc Vitals Group  ?   BP 01/03/22 2035 (!) 150/104  ?   Pulse Rate 01/03/22 2035 74  ?   Resp 01/03/22 2035 17  ?   Temp 01/03/22 2035 97.9 ?F (36.6 ?C)  ?   Temp src --   ?   SpO2 01/03/22 2035 100 %  ?   Weight 01/03/22 2036 170 lb (77.1 kg)  ?   Height 01/03/22 2036 5\' 9"  (1.753 m)  ?   Head Circumference --   ?   Peak Flow --   ?   Pain Score 01/03/22 2035 3  ?   Pain Loc --   ?   Pain Edu? --   ?   Excl. in GC? --   ? ?Most recent vital signs: ?Vitals:  ? 01/03/22 2035  ?BP: (!) 150/104  ?Pulse: 74  ?Resp: 17  ?Temp: 97.9 ?F (36.6 ?C)  ?SpO2: 100%  ? ?General: Awake, oriented x4. ?CV:  Good peripheral perfusion.  ?Resp:  Normal effort.  ?Abd:  No distention.  ?Other:  Middle-aged Caucasian male laying in bed in no distress.  Patient's eye exam shows disconjugate gaze and diplopia that resolves with covering 1 eye or the other.  Visual acuity intact with corrective lenses ?ED Results / Procedures / Treatments  ?RADIOLOGY ?ED MD interpretation: CT of the head without contrast  interpreted by me shows no evidence of acute abnormalities including no intracerebral hemorrhage, obvious masses, or significant edema ?-Agree with radiology assessment ?Official radiology report(s): ?CT Head Wo Contrast ? ?Result Date: 01/04/2022 ?CLINICAL DATA:  Double vision. EXAM: CT HEAD WITHOUT CONTRAST TECHNIQUE: Contiguous axial images were obtained from the base of the skull through the vertex without intravenous contrast. RADIATION DOSE REDUCTION: This exam was performed according to the departmental dose-optimization program which includes automated exposure control, adjustment of the mA and/or kV according to patient size and/or use of iterative reconstruction technique. COMPARISON:  None. FINDINGS: Brain: No evidence of acute infarction, hemorrhage, hydrocephalus, extra-axial collection or mass lesion/mass effect. Vascular: No hyperdense vessel or unexpected calcification. Skull: Normal. Negative for fracture or focal lesion. Sinuses/Orbits: No acute finding. Other: None. IMPRESSION: No acute intracranial pathology. Electronically Signed   By: 01/06/2022 M.D.   On: 01/04/2022 00:21   ?PROCEDURES: ?Critical Care performed: No ?Procedures ?MEDICATIONS ORDERED IN ED: ?Medications  ?butalbital-acetaminophen-caffeine (FIORICET) 50-325-40 MG per tablet 1 tablet (has no administration in time range)  ? ?IMPRESSION / MDM / ASSESSMENT AND PLAN / ED COURSE  ?I reviewed the triage vital signs and  the nursing notes. ?             ?               ?Differential diagnosis includes, but is not limited to, amblyopia, intracerebral tumor, AVM, CVA ?Patient a 46 year old male with the above-stated past medical history who presents for diplopia and posterior headache.  CT of the head does not show any evidence of acute intracranial abnormalities.  Patient's exam shows disconjugate gaze and binocular diplopia that resolves when covering 1 eye or the other.  Patient is already seeing ophthalmology for the symptoms.   There are no emergent causes of the symptoms as of this visit.  Patient encouraged to use over-the-counter medications as well as Fioricet for his continued headaches and to patch the left eye in order to alleviate his double vision.  Patient also encouraged to follow-up with ophthalmology/neurology as needed for further evaluation and management. ? ?Dispo: Discharge home with ophthalmology follow-up ? ?  ?FINAL CLINICAL IMPRESSION(S) / ED DIAGNOSES  ? ?Final diagnoses:  ?Occipital headache  ?Diplopia  ? ?Rx / DC Orders  ? ?ED Discharge Orders   ? ?      Ordered  ?  butalbital-acetaminophen-caffeine (FIORICET) 50-325-40 MG tablet  Every 6 hours PRN       ? 01/04/22 0107  ? ?  ?  ? ?  ? ?Note:  This document was prepared using Dragon voice recognition software and may include unintentional dictation errors. ?  ?Merwyn Katos, MD ?01/04/22 0114 ? ?

## 2022-05-25 ENCOUNTER — Ambulatory Visit (INDEPENDENT_AMBULATORY_CARE_PROVIDER_SITE_OTHER): Payer: Managed Care, Other (non HMO) | Admitting: Physician Assistant

## 2022-05-25 ENCOUNTER — Encounter: Payer: Self-pay | Admitting: Physician Assistant

## 2022-05-25 VITALS — BP 131/94 | HR 65 | Temp 97.8°F | Resp 14 | Ht 69.0 in | Wt 169.0 lb

## 2022-05-25 DIAGNOSIS — Z130 Encounter for screening for diseases of the blood and blood-forming organs and certain disorders involving the immune mechanism: Secondary | ICD-10-CM

## 2022-05-25 DIAGNOSIS — Z136 Encounter for screening for cardiovascular disorders: Secondary | ICD-10-CM | POA: Diagnosis not present

## 2022-05-25 DIAGNOSIS — E781 Pure hyperglyceridemia: Secondary | ICD-10-CM

## 2022-05-25 DIAGNOSIS — Z0000 Encounter for general adult medical examination without abnormal findings: Secondary | ICD-10-CM

## 2022-05-25 DIAGNOSIS — Z1329 Encounter for screening for other suspected endocrine disorder: Secondary | ICD-10-CM

## 2022-05-25 DIAGNOSIS — Z13228 Encounter for screening for other metabolic disorders: Secondary | ICD-10-CM

## 2022-05-25 DIAGNOSIS — R030 Elevated blood-pressure reading, without diagnosis of hypertension: Secondary | ICD-10-CM

## 2022-05-25 DIAGNOSIS — Z125 Encounter for screening for malignant neoplasm of prostate: Secondary | ICD-10-CM

## 2022-05-25 DIAGNOSIS — I10 Essential (primary) hypertension: Secondary | ICD-10-CM

## 2022-05-25 DIAGNOSIS — I456 Pre-excitation syndrome: Secondary | ICD-10-CM

## 2022-05-25 NOTE — Progress Notes (Signed)
I,Bryan Coleman,acting as a Neurosurgeon for [employer], PA-C.,have documented all relevant documentation on the behalf of Bryan Lat, PA-C,as directed by  [employer], PA-C while in the presence of [employer], PA-C.    Complete physical exam   Patient: Bryan Coleman   DOB: 02-Mar-1976   46 y.o. Male  MRN: [id number] Visit Date: 05/25/2022  Today's healthcare provider: Debera Lat, PA-C   Chief Complaint  Patient presents with  . Annual Exam   Subjective    [name] is a 46 y.o. male who presents today for a complete physical exam.  He reports consuming a general diet. The patient does not participate in regular exercise at present. He generally feels fairly well. He reports sleeping poorly (trouble falling asleep and staying asleep). He does not have additional problems to discuss today.  HPI  ***  Past Medical History:  Diagnosis Date  . Hypertension   . WPW (Wolff-Parkinson-White syndrome)    Past Surgical History:  Procedure Laterality Date  . COLONOSCOPY  03-10-12   Dr Bryan Coleman   Social History   Socioeconomic History  . Marital status: Single    Spouse name: Not on file  . Number of children: Not on file  . Years of education: Not on file  . Highest education level: Not on file  Occupational History  . Not on file  Tobacco Use  . Smoking status: Never  . Smokeless tobacco: Never  Vaping Use  . Vaping Use: Never used  Substance and Sexual Activity  . Alcohol use: No  . Drug use: No  . Sexual activity: Not on file  Other Topics Concern  . Not on file  Social History Narrative  . Not on file   Social Determinants of Health   Financial Resource Strain: Not on file  Food Insecurity: Not on file  Transportation Needs: Not on file  Physical Activity: Not on file  Stress: Not on file  Social Connections: Not on file  Intimate Partner Violence: Not on file   Family Status  Relation Name Status  . Mother  Alive  . Father  Alive  . MGM   Deceased  . MGF  Deceased  . PGM  Deceased  . PGF  Deceased  . Neg Hx  (Not Specified)   Family History  Problem Relation Age of Onset  . Healthy Mother   . Anemia Father   . Colon cancer Neg Hx   . Prostate cancer Neg Hx    Allergies  Allergen Reactions  . Camphor-Phenol Rash    blister  . Caramel Rash  . Chocolate Flavor Rash  . Dextromethorphan-Guaifenesin Rash  . Robitussin (Alcohol Free) [Guaifenesin] Rash  . Tape Rash    blister    Patient Care Team: Bryan Sailors, PA-C (Inactive) as PCP - General (Physician Assistant) Bryan Brightly, MD (General Surgery) Bryan Monday, MD as Referring Physician (Internal Medicine)   Medications: Outpatient Medications Prior to Visit  Medication Sig  . amLODipine (NORVASC) 5 MG tablet Take 1 tablet (5 mg total) by mouth daily. (Patient not taking: Reported on 05/25/2022)  . butalbital-acetaminophen-caffeine (FIORICET) 50-325-40 MG tablet Take 1-2 tablets by mouth every 6 (six) hours as needed for headache. (Patient not taking: Reported on 05/25/2022)  . losartan (COZAAR) 25 MG tablet Take 25 mg by mouth daily. (Patient not taking: Reported on 05/25/2022)  . metoprolol succinate (TOPROL-XL) 25 MG 24 hr tablet Take 25 mg by mouth daily. (Patient not  taking: Reported on 05/25/2022)   No facility-administered medications prior to visit.    Review of Systems  Constitutional:  Positive for fatigue. Negative for appetite change, chills and fever.  HENT:  Positive for tinnitus. Negative for congestion, ear pain, hearing loss, nosebleeds and trouble swallowing.   Eyes:  Negative for pain and visual disturbance.  Respiratory:  Negative for cough, chest tightness and shortness of breath.   Cardiovascular:  Negative for chest pain, palpitations and leg swelling.  Gastrointestinal:  Negative for abdominal pain, blood in stool, constipation, diarrhea, nausea and vomiting.  Endocrine: Negative for polydipsia, polyphagia and polyuria.   Genitourinary:  Positive for frequency. Negative for dysuria and flank pain.  Musculoskeletal:  Positive for arthralgias and back pain. Negative for joint swelling, myalgias and neck stiffness.  Skin:  Negative for color change, rash and wound.  Allergic/Immunologic: Positive for environmental allergies and food allergies.  Neurological:  Negative for dizziness, tremors, seizures, speech difficulty, weakness, light-headedness and headaches.  Psychiatric/Behavioral:  Positive for sleep disturbance. Negative for behavioral problems, confusion, decreased concentration and dysphoric mood. The patient is not nervous/anxious.   All other systems reviewed and are negative.   {Labs  Heme  Chem  Endocrine  Serology  Results Review (optional):23779}  Objective     BP (!) 131/94 (BP Location: Right Arm, Patient Position: Sitting, Cuff Size: Normal)   Pulse 65   Temp 97.8 F (36.6 C) (Oral)   Resp 14   Ht 5\' 9"  (1.753 m)   Wt 169 lb (76.7 kg)   SpO2 100% Comment: room air  BMI 24.96 kg/m  {Show previous vital signs (optional):23777}  Today's Vitals   05/25/22 1012 05/25/22 1016  BP: (!) 134/106 (!) 131/94  Pulse: 64 65  Resp: 14   Temp: 97.8 F (36.6 C)   TempSrc: Oral   SpO2: 100%   Weight: 169 lb (76.7 kg)   Height: 5\' 9"  (1.753 m)    Body mass index is 24.96 kg/m.   Physical Exam Vitals reviewed.  Constitutional:      General: He is not in acute distress.    Appearance: Normal appearance. He is well-developed. He is not diaphoretic.  HENT:     Head: Normocephalic and atraumatic.     Right Ear: Tympanic membrane, ear canal and external ear normal.     Left Ear: Tympanic membrane, ear canal and external ear normal.     Nose: Nose normal.     Mouth/Throat:     Mouth: Mucous membranes are moist.     Pharynx: Oropharynx is clear. No oropharyngeal exudate.  Eyes:     General: No scleral icterus.    Conjunctiva/sclera: Conjunctivae normal.     Pupils: Pupils are equal,  round, and reactive to light.  Neck:     Thyroid: No thyromegaly.  Cardiovascular:     Rate and Rhythm: Normal rate and regular rhythm.     Pulses: Normal pulses.     Heart sounds: Normal heart sounds. No murmur heard. Pulmonary:     Effort: Pulmonary effort is normal. No respiratory distress.     Breath sounds: Normal breath sounds. No wheezing or rales.  Abdominal:     General: There is no distension.     Palpations: Abdomen is soft.     Tenderness: There is no abdominal tenderness.  Musculoskeletal:        General: No deformity.     Cervical back: Neck supple.     Right lower leg: No edema.  Left lower leg: No edema.  Lymphadenopathy:     Cervical: No cervical adenopathy.  Skin:    General: Skin is warm and dry.     Findings: No rash.  Neurological:     Mental Status: He is alert and oriented to person, place, and time. Mental status is at baseline.     Sensory: No sensory deficit.     Motor: No weakness.     Gait: Gait normal.  Psychiatric:        Mood and Affect: Mood normal.        Behavior: Behavior normal.        Thought Content: Thought content normal.        Judgment: Judgment normal.      Last depression screening scores    05/25/2022   10:11 AM 04/07/2021   11:29 AM 03/24/2020    8:57 AM  PHQ 2/9 Scores  PHQ - 2 Score 0 0 0  PHQ- 9 Score 1 1    Last fall risk screening    05/25/2022   10:11 AM  Fall Risk   Falls in the past year? 0  Number falls in past yr: 0  Injury with Fall? 0  Risk for fall due to : No Fall Risks  Follow up Falls evaluation completed   Last Audit-C alcohol use screening    05/25/2022   10:11 AM  Alcohol Use Disorder Test (AUDIT)  1. How often do you have a drink containing alcohol? 0  2. How many drinks containing alcohol do you have on a typical day when you are drinking? 0  3. How often do you have six or more drinks on one occasion? 0  AUDIT-C Score 0   A score of 3 or more in women, and 4 or more in men indicates  increased risk for alcohol abuse, EXCEPT if all of the points are from question 1   No results found for any visits on 05/25/22.  Assessment & Plan    Routine Health Maintenance and Physical Exam  Exercise Activities and Dietary recommendations  Goals   None     Immunization History  Administered Date(s) Administered  . Moderna Sars-Covid-2 Vaccination 01/19/2020, 02/16/2020    Health Maintenance  Topic Date Due  . TETANUS/TDAP  Never done  . COLONOSCOPY (Pts 45-71yrs Insurance coverage will need to be confirmed)  Never done  . COVID-19 Vaccine (3 - Moderna series) 04/12/2020  . INFLUENZA VACCINE  Never done  . Hepatitis C Screening  Completed  . HIV Screening  Completed  . HPV VACCINES  Aged Out    Discussed health benefits of physical activity, and encouraged him to engage in regular exercise appropriate for his age and condition.  1. Encounter for special screening examination for cardiovascular disorder  - Lipid panel - EKG 12-Lead  2. Encounter for screening for hematologic disorder  - CBC  3. Screening for metabolic disorder *** - Comprehensive metabolic panel  4. Prostate cancer screening *** - PSA  5. Screening for thyroid disorder *** - TSH   6. Annual physical exam ***   FU in a week for BP    The patient was advised to call back or seek an in-person evaluation if the symptoms worsen or if the condition fails to improve as anticipated.  I discussed the assessment and treatment plan with the patient. The patient was provided an opportunity to ask questions and all were answered. The patient agreed with the plan and demonstrated an  understanding of the instructions.  The entirety of the information documented in the History of Present Illness, Review of Systems and Physical Exam were personally obtained by me. Portions of this information were initially documented by the CMA and reviewed by me for thoroughness and accuracy.  Portions of this  note were created using dictation software and may contain typographical errors.     Bryan Lat, PA-C  [facility] [phone] (phone) [phone] (fax)  [facility]

## 2022-05-26 LAB — COMPREHENSIVE METABOLIC PANEL
ALT: 18 IU/L (ref 0–44)
AST: 14 IU/L (ref 0–40)
Albumin/Globulin Ratio: 1.9 (ref 1.2–2.2)
Albumin: 4.4 g/dL (ref 4.1–5.1)
Alkaline Phosphatase: 76 IU/L (ref 44–121)
BUN/Creatinine Ratio: 15 (ref 9–20)
BUN: 12 mg/dL (ref 6–24)
Bilirubin Total: 0.5 mg/dL (ref 0.0–1.2)
CO2: 25 mmol/L (ref 20–29)
Calcium: 8.9 mg/dL (ref 8.7–10.2)
Chloride: 103 mmol/L (ref 96–106)
Creatinine, Ser: 0.8 mg/dL (ref 0.76–1.27)
Globulin, Total: 2.3 g/dL (ref 1.5–4.5)
Glucose: 91 mg/dL (ref 70–99)
Potassium: 4.5 mmol/L (ref 3.5–5.2)
Sodium: 142 mmol/L (ref 134–144)
Total Protein: 6.7 g/dL (ref 6.0–8.5)
eGFR: 109 mL/min/{1.73_m2} (ref >59)

## 2022-05-26 LAB — CBC
Hematocrit: 42 % (ref 37.5–51.0)
Hemoglobin: 14.6 g/dL (ref 13.0–17.7)
MCH: 30 pg (ref 26.6–33.0)
MCHC: 34.8 g/dL (ref 31.5–35.7)
MCV: 86 fL (ref 79–97)
Platelets: 225 10*3/uL (ref 150–450)
RBC: 4.87 x10E6/uL (ref 4.14–5.80)
RDW: 12.2 % (ref 11.6–15.4)
WBC: 6.2 10*3/uL (ref 3.4–10.8)

## 2022-05-26 LAB — LIPID PANEL
Chol/HDL Ratio: 4.2 ratio (ref 0.0–5.0)
Cholesterol, Total: 160 mg/dL (ref 100–199)
HDL: 38 mg/dL — ABNORMAL LOW (ref >39)
LDL Chol Calc (NIH): 98 mg/dL (ref 0–99)
Triglycerides: 135 mg/dL (ref 0–149)
VLDL Cholesterol Cal: 24 mg/dL (ref 5–40)

## 2022-05-26 LAB — TSH: TSH: 2.65 u[IU]/mL (ref 0.450–4.500)

## 2022-05-26 LAB — PSA: Prostate Specific Ag, Serum: 0.5 ng/mL (ref 0.0–4.0)

## 2022-06-05 NOTE — Progress Notes (Unsigned)
   I,Elena D DeSanto,acting as a scribe for Janna Ostwalt, PA-C.,have documented all relevant documentation on the behalf of Janna Ostwalt, PA-C,as directed by  Janna Ostwalt, PA-C while in the presence of Janna Ostwalt, PA-C.    Established patient visit   Patient: Bryan Coleman   DOB: 08/14/1976   46 y.o. Male  MRN: [id number] Visit Date: 06/06/2022  Today's healthcare provider: Janna Ostwalt, PA-C   No chief complaint on file.  Subjective    HPI  Hypertension, follow-up  BP Readings from Last 3 Encounters:  05/25/22 (!) 131/94  01/04/22 (!) 146/101  04/07/21 131/87   Wt Readings from Last 3 Encounters:  05/25/22 169 lb (76.7 kg)  01/03/22 170 lb (77.1 kg)  04/07/21 168 lb (76.2 kg)     He was last seen for hypertension 1 weeks ago.  BP at that visit was as above. Management since that visit includes none.  He reports {excellent/good/fair/poor:19665} compliance with treatment. He {is/is not:9024} having side effects. {document side effects if present:1} He is following a {diet:21022986} diet. He {is/is not:9024} exercising. He {does/does not:200015} smoke.  Use of agents associated with hypertension: {bp agents assoc with hypertension:511::"none"}.   Outside blood pressures are {***enter patient reported home BP readings, or 'not being checked':1}. Symptoms: {Yes/No:20286} chest pain {Yes/No:20286} chest pressure  {Yes/No:20286} palpitations {Yes/No:20286} syncope  {Yes/No:20286} dyspnea {Yes/No:20286} orthopnea  {Yes/No:20286} paroxysmal nocturnal dyspnea {Yes/No:20286} lower extremity edema   Pertinent labs Lab Results  Component Value Date   CHOL 160 05/25/2022   HDL 38 (L) 05/25/2022   LDLCALC 98 05/25/2022   TRIG 135 05/25/2022   CHOLHDL 4.2 05/25/2022   Lab Results  Component Value Date   NA 142 05/25/2022   K 4.5 05/25/2022   CREATININE 0.80 05/25/2022   EGFR 109 05/25/2022   GLUCOSE 91 05/25/2022   TSH 2.650 05/25/2022     The 10-year ASCVD  risk score (Arnett DK, et al., 2019) is: 3.3%  ---------------------------------------------------------------------------------------------------   Medications: Outpatient Medications Prior to Visit  Medication Sig   amLODipine (NORVASC) 5 MG tablet Take 1 tablet (5 mg total) by mouth daily. (Patient not taking: Reported on 05/25/2022)   butalbital-acetaminophen-caffeine (FIORICET) 50-325-40 MG tablet Take 1-2 tablets by mouth every 6 (six) hours as needed for headache. (Patient not taking: Reported on 05/25/2022)   losartan (COZAAR) 25 MG tablet Take 25 mg by mouth daily. (Patient not taking: Reported on 05/25/2022)   metoprolol succinate (TOPROL-XL) 25 MG 24 hr tablet Take 25 mg by mouth daily. (Patient not taking: Reported on 05/25/2022)   No facility-administered medications prior to visit.    Review of Systems  {Labs  Heme  Chem  Endocrine  Serology  Results Review (optional):23779}   Objective    There were no vitals taken for this visit. {Show previous vital signs (optional):23777}  Physical Exam  ***  No results found for any visits on 06/06/22.  Assessment & Plan     ***  No follow-ups on file.      {provider attestation***:1}   Janna Ostwalt, PA-C  [facility] [phone] (phone) [phone] (fax)   Medical Group 

## 2022-06-06 ENCOUNTER — Ambulatory Visit: Payer: Managed Care, Other (non HMO) | Admitting: Physician Assistant

## 2022-06-06 VITALS — BP 127/104 | HR 57 | Temp 97.9°F | Wt 172.0 lb

## 2022-06-06 DIAGNOSIS — R030 Elevated blood-pressure reading, without diagnosis of hypertension: Secondary | ICD-10-CM

## 2022-06-06 DIAGNOSIS — E7889 Other lipoprotein metabolism disorders: Secondary | ICD-10-CM

## 2022-06-06 DIAGNOSIS — I10 Essential (primary) hypertension: Secondary | ICD-10-CM | POA: Diagnosis not present

## 2022-06-06 MED ORDER — METOPROLOL SUCCINATE ER 25 MG PO TB24: 25.0000 mg | ORAL_TABLET | Freq: Every day | ORAL | 1 refills | Status: DC

## 2022-06-06 MED ORDER — AMLODIPINE BESYLATE 5 MG PO TABS: ORAL_TABLET | ORAL | 0 refills | Status: DC

## 2022-06-06 MED ORDER — LOSARTAN POTASSIUM 25 MG PO TABS: 25.0000 mg | ORAL_TABLET | Freq: Every day | ORAL | 1 refills | Status: DC

## 2022-07-19 ENCOUNTER — Encounter: Payer: Self-pay | Admitting: Physician Assistant

## 2022-07-19 ENCOUNTER — Ambulatory Visit: Payer: Managed Care, Other (non HMO) | Admitting: Physician Assistant

## 2022-07-19 VITALS — BP 119/77 | HR 65 | Resp 16 | Wt 175.0 lb

## 2022-07-19 DIAGNOSIS — I456 Pre-excitation syndrome: Secondary | ICD-10-CM | POA: Diagnosis not present

## 2022-07-19 DIAGNOSIS — I10 Essential (primary) hypertension: Secondary | ICD-10-CM | POA: Diagnosis not present

## 2022-07-19 NOTE — Progress Notes (Signed)
I,April Miller,acting as a Education administrator for [employer], PA-C.,have documented all relevant documentation on the behalf of Mardene Speak, PA-C,as directed by  [employer], PA-C while in the presence of [employer], PA-C.   Established patient visit   Patient: Bryan Coleman   DOB: [birth year]   46 y.o. Male  MRN: [id number] Visit Date: 07/19/2022  Today's healthcare provider: Mardene Speak, PA-C   Chief Complaint  Patient presents with   Follow-up   Hypertension   Subjective    HPI  Hypertension, follow-up  BP Readings from Last 3 Encounters:  07/19/22 119/77  06/06/22 (!) 127/104  05/25/22 (!) 131/94   Wt Readings from Last 3 Encounters:  07/19/22 175 lb (79.4 kg)  06/06/22 172 lb (78 kg)  05/25/22 169 lb (76.7 kg)     He was last seen for hypertension 1 months ago.  Management since that visit includes; Added the third medication 2.5 mg increase up to 5 mg if home BP levels will be high.  Outside blood pressures are 135/94.  Pertinent labs Lab Results  Component Value Date   CHOL 160 05/25/2022   HDL 38 (L) 05/25/2022   LDLCALC 98 05/25/2022   TRIG 135 05/25/2022   CHOLHDL 4.2 05/25/2022   Lab Results  Component Value Date   NA 142 05/25/2022   K 4.5 05/25/2022   CREATININE 0.80 05/25/2022   EGFR 109 05/25/2022   GLUCOSE 91 05/25/2022   TSH 2.650 05/25/2022     The 10-year ASCVD risk score (Arnett DK, et al., 2019) is: 2.8%  ---------------------------------------------------------------------------------------------------   Medications: Outpatient Medications Prior to Visit  Medication Sig   amLODipine (NORVASC) 5 MG tablet Start  with 1/2 tab daily   butalbital-acetaminophen-caffeine (FIORICET) 50-325-40 MG tablet Take 1-2 tablets by mouth every 6 (six) hours as needed for headache.   losartan (COZAAR) 25 MG tablet Take 1 tablet (25 mg total) by mouth daily.   metoprolol succinate (TOPROL-XL) 25 MG 24 hr tablet Take 1 tablet (25 mg total) by  mouth daily.   No facility-administered medications prior to visit.    Review of Systems  Constitutional:  Negative for appetite change, chills and fever.  Respiratory:  Negative for chest tightness, shortness of breath and wheezing.   Cardiovascular:  Negative for chest pain and palpitations.  Gastrointestinal:  Negative for abdominal pain, nausea and vomiting.       Objective    BP 119/77 (BP Location: Right Arm, Patient Position: Sitting, Cuff Size: Large)   Pulse 65   Resp 16   Wt 175 lb (79.4 kg)   SpO2 98%   BMI 25.84 kg/m    Physical Exam Vitals reviewed.  Constitutional:      General: He is not in acute distress.    Appearance: Normal appearance. He is not diaphoretic.  HENT:     Head: Normocephalic and atraumatic.  Eyes:     General: No scleral icterus.    Extraocular Movements: Extraocular movements intact.     Conjunctiva/sclera: Conjunctivae normal.     Pupils: Pupils are equal, round, and reactive to light.  Cardiovascular:     Rate and Rhythm: Normal rate and regular rhythm.     Pulses: Normal pulses.     Heart sounds: Normal heart sounds. No murmur heard. Pulmonary:     Effort: Pulmonary effort is normal. No respiratory distress.     Breath sounds: Normal breath sounds. No wheezing or rhonchi.  Musculoskeletal:     Cervical back:  Neck supple.     Right lower leg: No edema.     Left lower leg: No edema.  Lymphadenopathy:     Cervical: No cervical adenopathy.  Skin:    General: Skin is warm and dry.     Findings: No rash.  Neurological:     Mental Status: He is alert and oriented to person, place, and time. Mental status is at baseline.  Psychiatric:        Mood and Affect: Mood normal.        Behavior: Behavior normal.        Thought Content: Thought content normal.        Judgment: Judgment normal.       No results found for any visits on 07/19/22.  Assessment & Plan     1. Essential hypertension BP today stable and  well-controlled Advised to start BP log , adhere to low salt diet and exercise routine Continue current regimen Might increase losartan to 20m if his BP will increase at home  Continue taking amlodipine 511mand watch for leg swelling  2. WPW (Wolff-Parkinson-White syndrome)  FU in 3 mo The patient was advised to call back or seek an in-person evaluation if the symptoms worsen or if the condition fails to improve as anticipated.  I discussed the assessment and treatment plan with the patient. The patient was provided an opportunity to ask questions and all were answered. The patient agreed with the plan and demonstrated an understanding of the instructions.  The entirety of the information documented in the History of Present Illness, Review of Systems and Physical Exam were personally obtained by me. Portions of this information were initially documented by the CMA and reviewed by me for thoroughness and accuracy.  Portions of this note were created using dictation software and may contain typographical errors.   JaMardene SpeakPA-C  [phone]) [phone])  [facility]

## 2022-07-23 IMAGING — CT CT HEAD W/O CM
4 series · 16 of 47 positions shown, 18 images · non-contrast
Comparison: None.

CLINICAL DATA: Double vision.



[Series 2: head wo · axial · 0.44mm/px · z∈[-151,-36]mm · 7 of 31 slices shown, 9 images]
[im 4/31  brain]
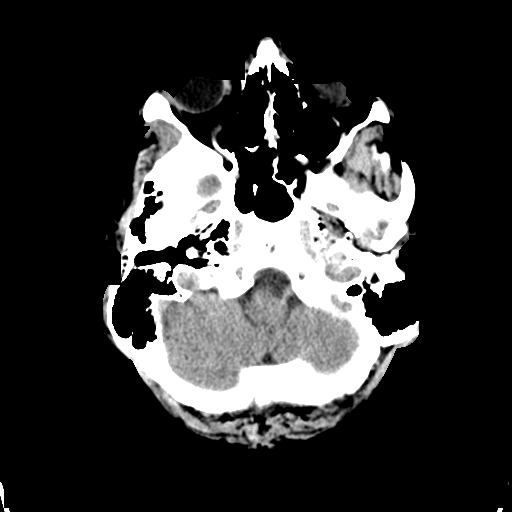
[im 4/31  bone]
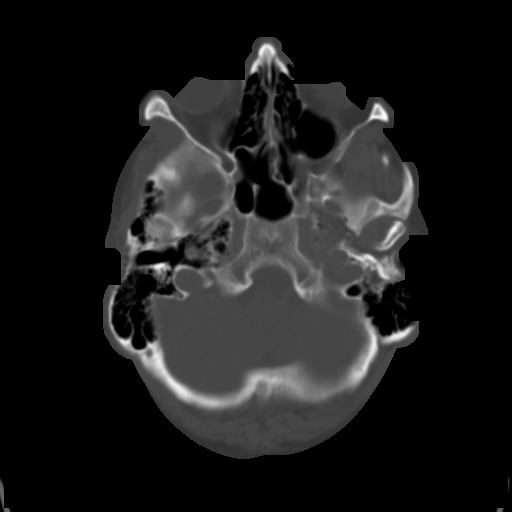
[im 8/31  brain]
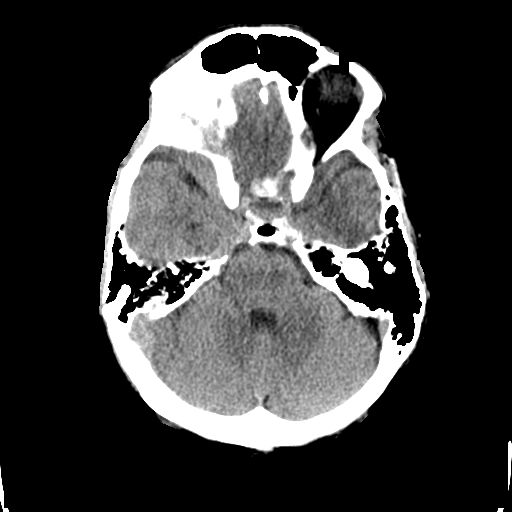
[im 12/31  brain]
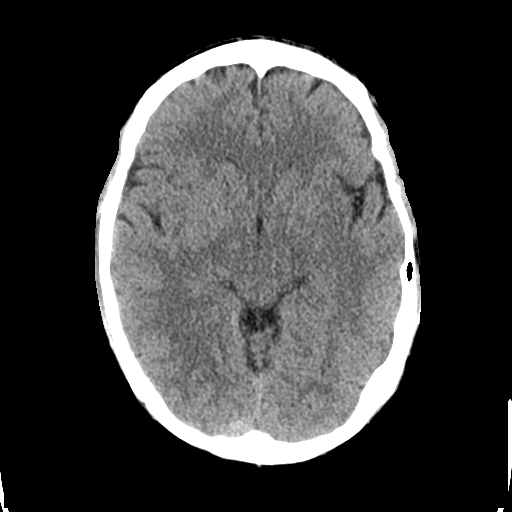
[im 16/31  brain]
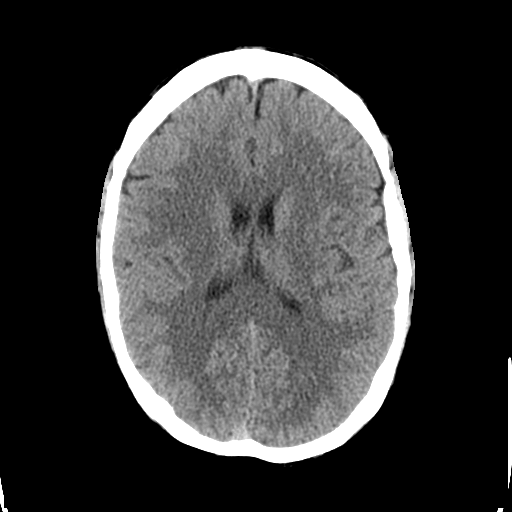
[im 19/31  brain]
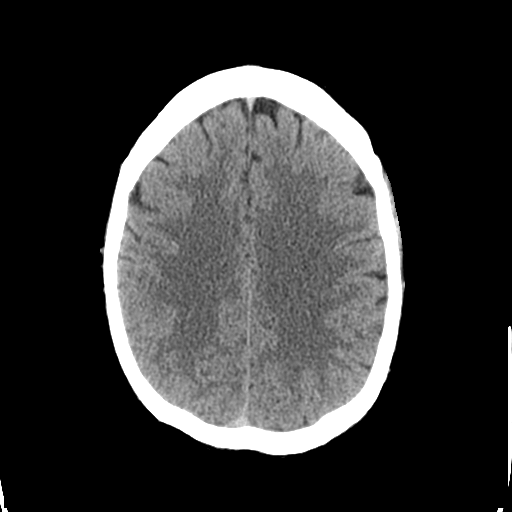
[im 19/31  bone]
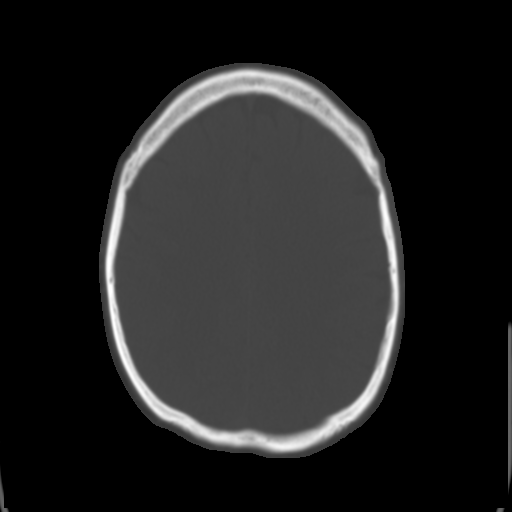
[im 23/31  brain]
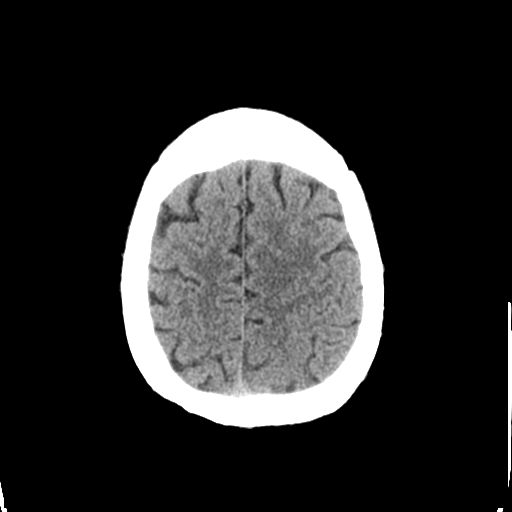
[im 27/31  brain]
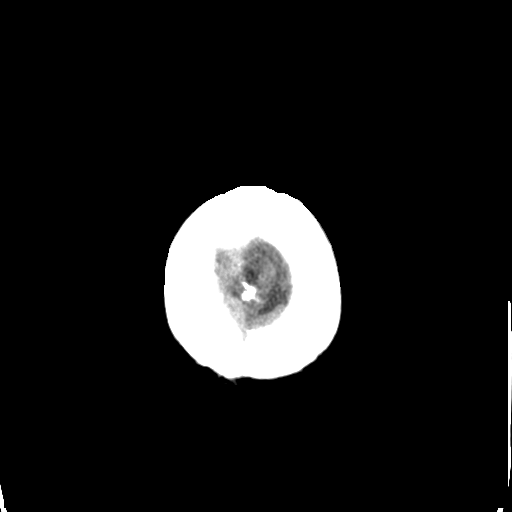

[Series 3: head bone · axial · 0.44mm/px · z∈[-152,-122]mm · 3 of 77 slices shown]
[im 8/77  bone]
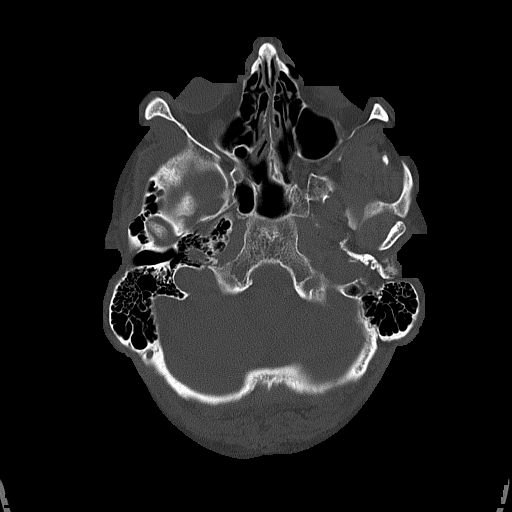
[im 16/77  bone]
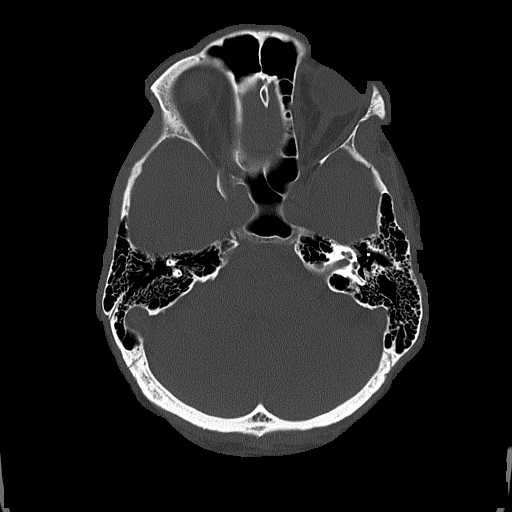
[im 23/77  bone]
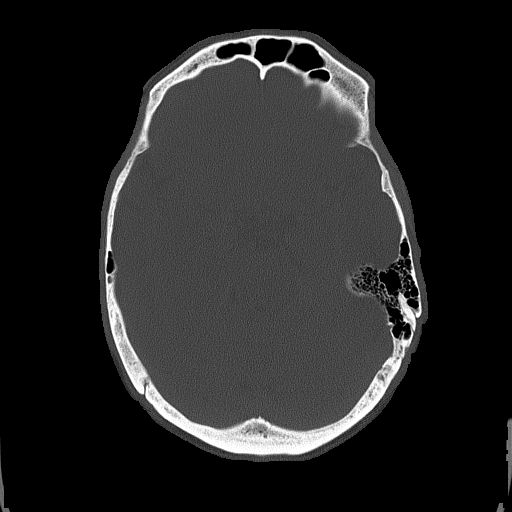

[Series 4: coronal soft tissue · coronal · 0.31mm/px · 3 of 70 slices shown]
[im 24/70  brain]
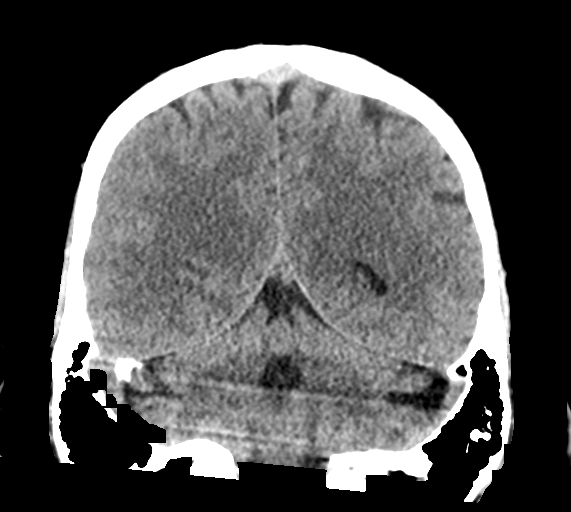
[im 31/70  brain]
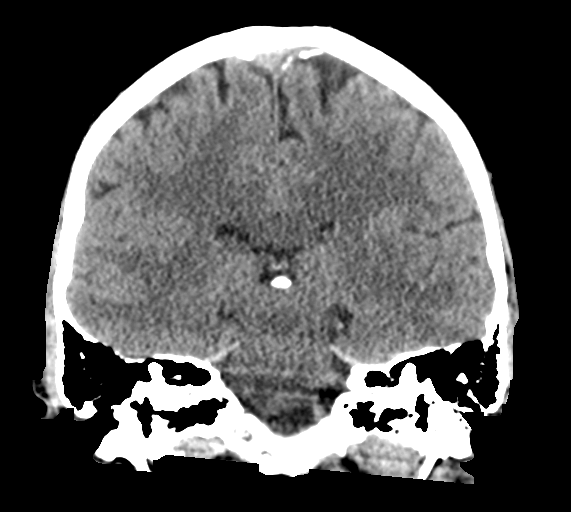
[im 39/70  brain]
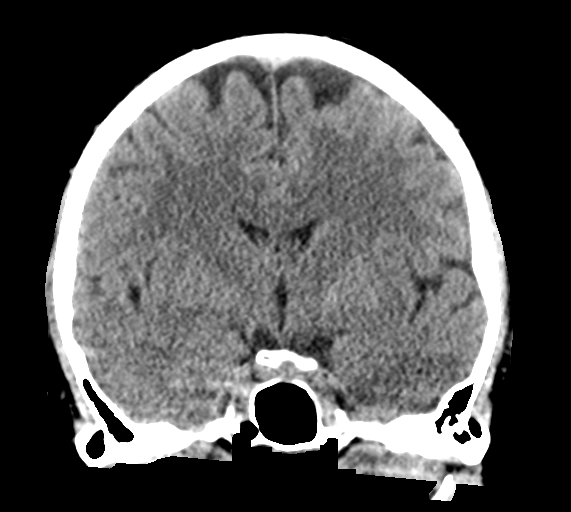

[Series 5: sagittal soft tissue · sagittal · 0.31mm/px · 3 of 55 slices shown]
[im 19/55  brain]
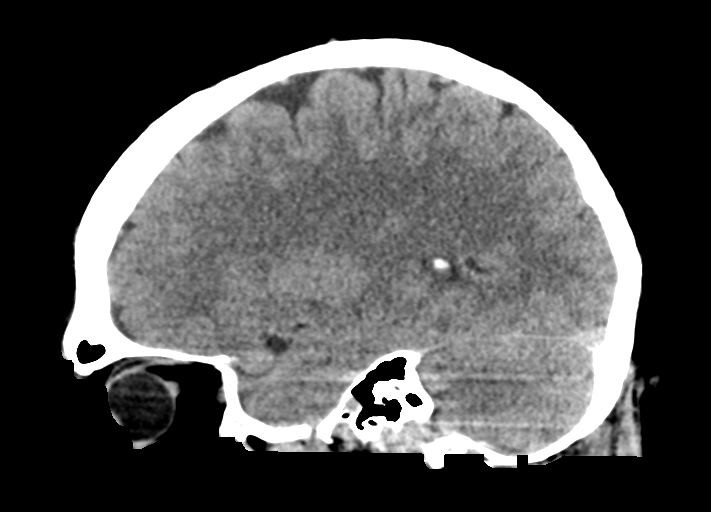
[im 28/55  brain]
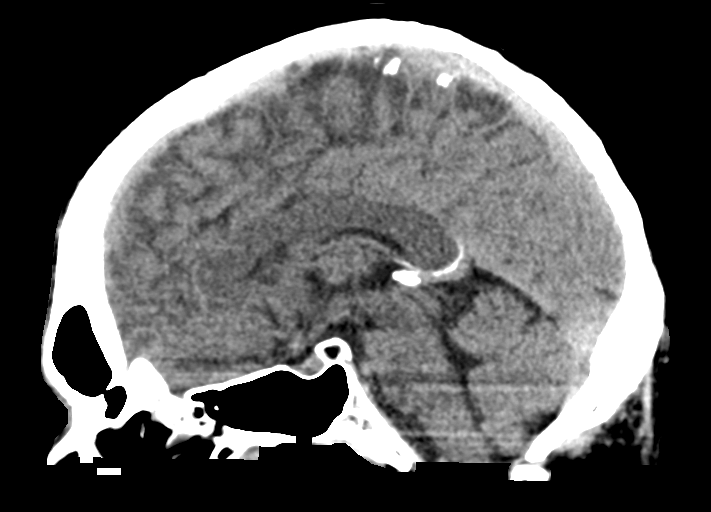
[im 37/55  brain]
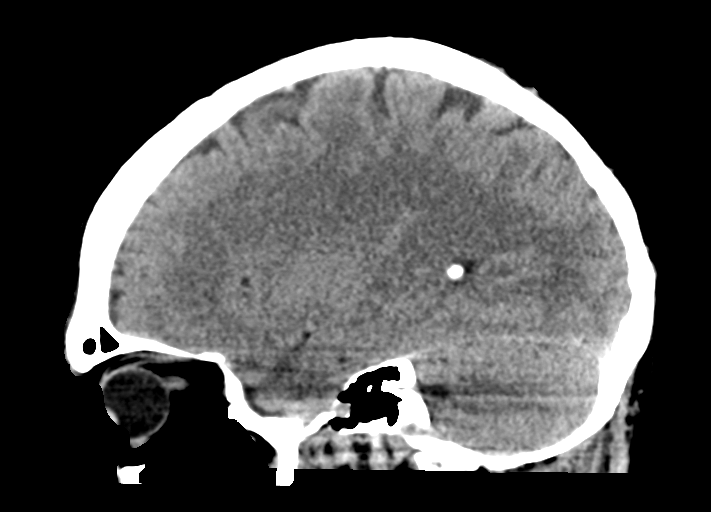

[16 of 47 positions shown; findings below may reference images not displayed]

FINDINGS: Brain: No evidence of acute infarction, hemorrhage, hydrocephalus,
extra-axial collection or mass lesion/mass effect.

Vascular: No hyperdense vessel or unexpected calcification.

Skull: Normal. Negative for fracture or focal lesion.

Sinuses/Orbits: No acute finding.

Other: None.
IMPRESSION: No acute intracranial pathology.

## 2022-10-19 ENCOUNTER — Ambulatory Visit: Payer: Managed Care, Other (non HMO) | Admitting: Physician Assistant

## 2022-10-19 ENCOUNTER — Encounter: Payer: Self-pay | Admitting: Physician Assistant

## 2022-10-19 VITALS — BP 116/88 | HR 60 | Temp 97.7°F | Ht 69.0 in | Wt 172.0 lb

## 2022-10-19 DIAGNOSIS — I456 Pre-excitation syndrome: Secondary | ICD-10-CM

## 2022-10-19 DIAGNOSIS — R030 Elevated blood-pressure reading, without diagnosis of hypertension: Secondary | ICD-10-CM | POA: Diagnosis not present

## 2022-10-19 DIAGNOSIS — I10 Essential (primary) hypertension: Secondary | ICD-10-CM

## 2022-10-19 DIAGNOSIS — Z23 Encounter for immunization: Secondary | ICD-10-CM | POA: Diagnosis not present

## 2022-10-19 DIAGNOSIS — E7889 Other lipoprotein metabolism disorders: Secondary | ICD-10-CM

## 2022-10-19 MED ORDER — AMLODIPINE BESYLATE 5 MG PO TABS: ORAL_TABLET | ORAL | 0 refills | Status: DC

## 2022-10-19 MED ORDER — METOPROLOL SUCCINATE ER 25 MG PO TB24: 25.0000 mg | ORAL_TABLET | Freq: Every day | ORAL | 1 refills | Status: DC

## 2022-10-19 MED ORDER — LOSARTAN POTASSIUM 25 MG PO TABS: 25.0000 mg | ORAL_TABLET | Freq: Every day | ORAL | 1 refills | Status: DC

## 2022-10-19 NOTE — Addendum Note (Signed)
Addended by: Elta Guadeloupe on: 10/19/2022 09:05 AM   Modules accepted: Orders

## 2022-10-19 NOTE — Progress Notes (Signed)
Established patient visit   Patient: Bryan Coleman   DOB: 20-Oct-1975   47 y.o. Male  MRN: [id number] Visit Date: 10/19/2022  Today's healthcare provider: Mardene Speak, PA-C   CC: HTN FU  Subjective     HPI   F/u hypertension. Last edited by Elta Guadeloupe, CMA on 10/19/2022  8:09 AM.      Hypertension, follow-up  BP Readings from Last 3 Encounters:  10/19/22 116/88  07/19/22 119/77  06/06/22 (!) 127/104   Wt Readings from Last 3 Encounters:  10/19/22 172 lb (78 kg)  07/19/22 175 lb (79.4 kg)  06/06/22 172 lb (78 kg)     He was last seen for hypertension 5 months ago.  BP at that visit was WNL. Management since that visit includes triple therapy.  He reports fair compliance with treatment. He is not having side effects.  He is trying to followi a Low Sodium diet. He is not exercising. He does not smoke.  Outside blood pressures are 135/94 --- 116/88 No chest pain No chest pressure  No palpitations No syncope  No dyspnea No orthopnea  No paroxysmal nocturnal dyspnea No lower extremity edema   Pertinent labs Lab Results  Component Value Date   CHOL 160 05/25/2022   HDL 38 (L) 05/25/2022   LDLCALC 98 05/25/2022   TRIG 135 05/25/2022   CHOLHDL 4.2 05/25/2022   Lab Results  Component Value Date   NA 142 05/25/2022   K 4.5 05/25/2022   CREATININE 0.80 05/25/2022   EGFR 109 05/25/2022   GLUCOSE 91 05/25/2022   TSH 2.650 05/25/2022     The 10-year ASCVD risk score (Arnett DK, et al., 2019) is: 2.7%  ---------------------------------------------------------------------------------------------------   Medications: Outpatient Medications Prior to Visit  Medication Sig   amLODipine (NORVASC) 5 MG tablet Start  with 1/2 tab daily   butalbital-acetaminophen-caffeine (FIORICET) 50-325-40 MG tablet Take 1-2 tablets by mouth every 6 (six) hours as needed for headache.   losartan (COZAAR) 25 MG tablet Take 1 tablet (25 mg total) by mouth daily.    metoprolol succinate (TOPROL-XL) 25 MG 24 hr tablet Take 1 tablet (25 mg total) by mouth daily.   No facility-administered medications prior to visit.    Review of Systems  All other systems reviewed and are negative. Except see hpi     Objective    BP 116/88 (BP Location: Left Arm, Patient Position: Sitting, Cuff Size: Normal)   Pulse 60   Temp 97.7 F (36.5 C)   Ht 5\' 9"  (1.753 m)   Wt 172 lb (78 kg)   SpO2 97%   BMI 25.40 kg/m    Physical Exam Vitals reviewed.  Constitutional:      General: He is not in acute distress.    Appearance: Normal appearance. He is not diaphoretic.  HENT:     Head: Normocephalic and atraumatic.     Right Ear: Ear canal and external ear normal.     Left Ear: Ear canal and external ear normal.     Nose: Congestion (mild) and rhinorrhea present.  Eyes:     General: No scleral icterus.    Conjunctiva/sclera: Conjunctivae normal.  Cardiovascular:     Rate and Rhythm: Normal rate and regular rhythm.     Pulses: Normal pulses.     Heart sounds: Normal heart sounds. No murmur heard. Pulmonary:     Effort: Pulmonary effort is normal. No respiratory distress.     Breath sounds: Normal breath  sounds. No wheezing or rhonchi.  Musculoskeletal:     Cervical back: Neck supple.     Right lower leg: No edema.     Left lower leg: No edema.  Lymphadenopathy:     Cervical: No cervical adenopathy.  Skin:    General: Skin is warm and dry.     Findings: No rash.  Neurological:     Mental Status: He is alert and oriented to person, place, and time. Mental status is at baseline.  Psychiatric:        Behavior: Behavior normal.        Thought Content: Thought content normal.        Judgment: Judgment normal.       No results found for any visits on 10/19/22.  Assessment & Plan     1. Essential hypertension Chronic and stable His BP today was WNL Continue current regimen: Metoprolol 25mg , amlodipine, valsartan 25 mg Encouraged to adhere to low  salt diet and daily exercise  2. WPW (Wolff-Parkinson-White syndrome) Chronic and stable Asymptomatic Pt was advised to d/c amlodipine 2.5/pt has been taking 5mg , pt declined for now? Will reassess at FU  3. Lipids abnormal Chronic and stable Last LP on 05/25/22: LDL 98, HDL 38 Advised to proceed with lifestyle modifications The 10-year ASCVD risk score (Arnett DK, et al., 2019) is: 2.7%   Values used to calculate the score:     Age: 37 years     Sex: Male     Is Non-Hispanic African American: No     Diabetic: No     Tobacco smoker: No     Systolic Blood Pressure: 970 mmHg     Is BP treated: Yes     HDL Cholesterol: 38 mg/dL     Total Cholesterol: 160 mg/dL Will need to update LP at his next FU Will FU   FU in 4 mo     The patient was advised to call back or seek an in-person evaluation if the symptoms worsen or if the condition fails to improve as anticipated.  I discussed the assessment and treatment plan with the patient. The patient was provided an opportunity to ask questions and all were answered. The patient agreed with the plan and demonstrated an understanding of the instructions.  The entirety of the information documented in the History of Present Illness, Review of Systems and Physical Exam were personally obtained by me. Portions of this information were initially documented by the CMA and reviewed by me for thoroughness and accuracy.  Mardene Speak, [facility], [place] [phone] (phone) [phone] (fax)

## 2023-02-18 NOTE — Progress Notes (Unsigned)
Established patient visit  Patient: Bryan Coleman   DOB: 20-Jun-1976   47 y.o. Male  MRN: [id number] Visit Date: 02/19/2023  Today's healthcare provider: Debera Lat, PA-C   No chief complaint on file.  Subjective    HPI  Hypertension, follow-up  BP Readings from Last 3 Encounters:  02/19/23 118/81  10/19/22 116/88  07/19/22 119/77   Wt Readings from Last 3 Encounters:  02/19/23 166 lb (75.3 kg)  10/19/22 172 lb (78 kg)  07/19/22 175 lb (79.4 kg)     He was last seen for hypertension 3 months ago.  BP at that visit was 116/88. Management since that visit includes  Amlodipine 5 mg, losartan 25 mg, metoprolol 25 mg.  He reports good compliance with treatment. He is not having side effects.  He is following a Low Sodium diet. He is not exercising. He does not smoke.  Outside blood pressures are 117/84. 134/91, 119/84 and 131/91 for the past 3 days. Symptoms: No chest pain No chest pressure  No palpitations No syncope  No dyspnea No orthopnea  No paroxysmal nocturnal dyspnea No lower extremity edema   Pertinent labs Lab Results  Component Value Date   CHOL 160 05/25/2022   HDL 38 (L) 05/25/2022   LDLCALC 98 05/25/2022   TRIG 135 05/25/2022   CHOLHDL 4.2 05/25/2022   Lab Results  Component Value Date   NA 142 05/25/2022   K 4.5 05/25/2022   CREATININE 0.80 05/25/2022   EGFR 109 05/25/2022   GLUCOSE 91 05/25/2022   TSH 2.650 05/25/2022     The 10-year ASCVD risk score (Arnett DK, et al., 2019) is: 2.8%  ---------------------------------------------------------------------------------------------------      10/19/2022    8:11 AM 05/25/2022   10:11 AM 04/07/2021   11:29 AM  Depression screen PHQ 2/9  Decreased Interest 0 0 0  Down, Depressed, Hopeless 0 0 0  PHQ - 2 Score 0 0 0  Altered sleeping 0 1 1  Tired, decreased energy 0 0 0  Change in appetite 0 0 0  Feeling bad or failure about yourself  0 0 0  Trouble concentrating 0 0 0  Moving slowly or  fidgety/restless 0 0 0  Suicidal thoughts 0 0 0  PHQ-9 Score 0 1 1  Difficult doing work/chores Not difficult at all Not difficult at all Not difficult at all       No data to display          Medications: Outpatient Medications Prior to Visit  Medication Sig   amLODipine (NORVASC) 5 MG tablet Start  with 1/2 tab daily   losartan (COZAAR) 25 MG tablet Take 1 tablet (25 mg total) by mouth daily.   metoprolol succinate (TOPROL-XL) 25 MG 24 hr tablet Take 1 tablet (25 mg total) by mouth daily.   No facility-administered medications prior to visit.    Review of Systems Except see HPI      Objective    There were no vitals taken for this visit.   Physical Exam   No results found for any visits on 02/19/23.  Assessment & Plan     Problem List Items Addressed This Visit       Cardiovascular and Mediastinum   WPW (Wolff-Parkinson-White syndrome)    Chronic and stable, asymptomatic Pt still preferred to continue taking amlodipine and not d/c it Will reassess need for amlodipine at his next appt Will FU      Relevant Medications   amLODipine (NORVASC) 5 MG  tablet   Essential hypertension - Primary    Chronic and previously stable His BP today was 118/81, at goal Continue his current regimen: amlodipine, metoprolol and valsartan 25mg  Encouraged to adhere to low salt diet and regular exercise      Relevant Medications   amLODipine (NORVASC) 5 MG tablet     Other   Lipids abnormal    Chronic and stable Last LP was WNL: LDL  98 and HDL 38.  The 10-year ASCVD risk score (Arnett DK, et al., 2019) is: 2.8% He was advised low cholesterol diet and daily exercise Will recheck his labs in September, during his physical Will FU          No follow-ups on file. CPE was scheduled for September of 2024.   Meds will be refilled in 3 mo. Will need to repeat labs.  The patient was advised to call back or seek an in-person evaluation if the symptoms worsen or if the  condition fails to improve as anticipated.  I discussed the assessment and treatment plan with the patient. The patient was provided an opportunity to ask questions and all were answered. The patient agreed with the plan and demonstrated an understanding of the instructions.  I, Debera Lat, PA-C have reviewed all documentation for this visit. The documentation on  02/19/23  for the exam, diagnosis, procedures, and orders are all accurate and complete.  Debera Lat, [facility], MMS [facility] [phone] (phone) [phone] (fax)  [facility]

## 2023-02-19 ENCOUNTER — Ambulatory Visit: Payer: Managed Care, Other (non HMO) | Admitting: Physician Assistant

## 2023-02-19 ENCOUNTER — Encounter: Payer: Self-pay | Admitting: Physician Assistant

## 2023-02-19 VITALS — BP 118/81 | HR 96 | Ht 69.0 in | Wt 166.0 lb

## 2023-02-19 DIAGNOSIS — E7889 Other lipoprotein metabolism disorders: Secondary | ICD-10-CM | POA: Diagnosis not present

## 2023-02-19 DIAGNOSIS — I456 Pre-excitation syndrome: Secondary | ICD-10-CM | POA: Diagnosis not present

## 2023-02-19 DIAGNOSIS — I10 Essential (primary) hypertension: Secondary | ICD-10-CM | POA: Insufficient documentation

## 2023-02-19 MED ORDER — AMLODIPINE BESYLATE 5 MG PO TABS
ORAL_TABLET | ORAL | 0 refills | Status: DC
Start: 2023-02-19 — End: 2023-06-13

## 2023-02-19 NOTE — Assessment & Plan Note (Addendum)
Chronic and stable Last LP was WNL: LDL  98 and HDL 38.  The 10-year ASCVD risk score (Arnett DK, et al., 2019) is: 2.8% He was advised low cholesterol diet and daily exercise Will recheck his labs in September, during his physical Will FU

## 2023-02-19 NOTE — Assessment & Plan Note (Signed)
Chronic and stable, asymptomatic Pt still preferred to continue taking amlodipine and not d/c it Will reassess need for amlodipine at his next appt Will FU

## 2023-02-19 NOTE — Assessment & Plan Note (Signed)
Chronic and previously stable His BP today was 118/81, at goal Continue his current regimen: amlodipine, metoprolol and valsartan 25mg  Encouraged to adhere to low salt diet and regular exercise

## 2023-05-26 NOTE — Progress Notes (Signed)
Complete physical exam  Patient: Bryan Coleman   DOB: 08-20-1976   47 y.o. Male  MRN: [id number] Visit Date: 05/30/2023  Today's healthcare provider: Debera Lat, PA-C   Chief Complaint  Patient presents with   Annual Exam    No concerns today    Subjective    Bryan Coleman is a 47 y.o. male who presents today for a complete physical exam.      Discussed the use of AI scribe software for clinical note transcription with the patient, who gave verbal consent to proceed.  History of Present Illness   The patient, with a history of hypertension, presents with concerns about their blood pressure. They report a range of readings from 130s to 140s systolic and 76 diastolic. They have made dietary changes, reducing their salt intake. They are currently on amlodipine, losartan, and metoprolol.  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 They acknowledge that they are not getting as much exercise as before due to the nature of their new job.  They also report a history of double vision (diplopia), for which they received a new prescription and prism stickers on their glasses. They last saw an eye doctor in April of the current year.  The patient has a history of COVID-19 infection, which they report resolved after about a week, with the main symptom being a persistent cough. They have received the initial COVID-19 vaccination, a second dose, and a booster.  They deny any current pain, fatigue, or tiredness. They also deny any problems with bowel movements or urination, and report no leg swelling.        Last depression screening scores    05/30/2023    8:29 AM 02/19/2023    1:44 PM 10/19/2022    8:11 AM  PHQ 2/9 Scores  PHQ - 2 Score 0 0 0  PHQ- 9 Score 1 0 0   Last fall risk screening    02/19/2023    1:44 PM  Fall Risk   Falls in the past year?  0  Number falls in past yr: 0  Injury with Fall? 0   Last Audit-C alcohol use screening    02/19/2023    1:44 PM  Alcohol Use Disorder Test (AUDIT)  1. How often do you have a drink containing alcohol? 0   A score of 3 or more in women, and 4 or more in men indicates increased risk for alcohol abuse, EXCEPT if all of the points are from question 1   Past Medical History:  Diagnosis Date   Hypertension    WPW (Wolff-Parkinson-White syndrome)    Past Surgical History:  Procedure Laterality Date   COLONOSCOPY  03-10-12   Dr Bluford Kaufmann   Social History   Socioeconomic History   Marital status: Single    Spouse name: Not on file   Number of children: Not on file   Years of education: Not on file   Highest education level: Not on file  Occupational History   Not on file  Tobacco Use   Smoking status: Never   Smokeless tobacco: Never  Vaping Use   Vaping status: Never Used  Substance and Sexual Activity   Alcohol use: No   Drug use: No   Sexual activity: Not on file  Other Topics Concern   Not on file  Social History Narrative   Not on  file   Social Determinants of Health   Financial Resource Strain: Not on file  Food Insecurity: Not on file  Transportation Needs: Not on file  Physical Activity: Not on file  Stress: Not on file  Social Connections: Not on file  Intimate Partner Violence: Not on file   Family Status  Relation Name Status   Mother  Alive   Father  Alive   MGM  Deceased   MGF  Deceased   PGM  Deceased   PGF  Deceased   Neg Hx  (Not Specified)  No partnership data on file   Family History  Problem Relation Age of Onset   Healthy Mother    Anemia Father    Colon cancer Neg Hx    Prostate cancer Neg Hx    Allergies  Allergen Reactions   Camphor-Phenol Rash    blister   Caramel Rash   Chocolate Flavor Rash   Dextromethorphan-Guaifenesin Rash   Robitussin (Alcohol Free) [Guaifenesin] Rash   Tape Rash    blister    Patient Care  Team: Debera Lat, PA-C as PCP - General (Physician Assistant) Kieth Brightly, MD (General Surgery) Sherron Monday, MD as Referring Physician (Internal Medicine)   Medications: Outpatient Medications Prior to Visit  Medication Sig   amLODipine (NORVASC) 5 MG tablet Start  with 1/2 tab daily   losartan (COZAAR) 25 MG tablet Take 1 tablet (25 mg total) by mouth daily.   metoprolol succinate (TOPROL-XL) 25 MG 24 hr tablet Take 1 tablet (25 mg total) by mouth daily.   No facility-administered medications prior to visit.    Review of Systems  All other systems reviewed and are negative.  Except see HPI     Objective    BP (!) 141/107 (BP Location: Left Arm, Patient Position: Sitting, Cuff Size: Normal)   Pulse 66   Ht 5\' 9"  (1.753 m)   Wt 163 lb 14.4 oz (74.3 kg)   SpO2 100%   BMI 24.20 kg/m      Physical Exam Vitals and nursing note reviewed.  Constitutional:      General: He is not in acute distress.    Appearance: Normal appearance. He is obese. He is not ill-appearing, toxic-appearing or diaphoretic.  HENT:     Head: Normocephalic and atraumatic.     Right Ear: Tympanic membrane, ear canal and external ear normal.     Left Ear: Tympanic membrane, ear canal and external ear normal.     Nose: Nose normal. No congestion or rhinorrhea.     Mouth/Throat:     Mouth: Mucous membranes are moist.     Pharynx: Oropharynx is clear. No oropharyngeal exudate or posterior oropharyngeal erythema.  Eyes:     General: No scleral icterus.       Right eye: No discharge.        Left eye: No discharge.     Extraocular Movements: Extraocular movements intact.     Conjunctiva/sclera: Conjunctivae normal.     Pupils: Pupils are equal, round, and reactive to light.  Neck:     Vascular: No carotid bruit.  Cardiovascular:     Rate and Rhythm: Normal rate and regular rhythm.     Pulses: Normal pulses.     Heart sounds: Normal heart sounds. No murmur heard.    No  friction rub. No gallop.  Pulmonary:     Effort: Pulmonary effort is normal. No respiratory distress.     Breath sounds: Normal breath sounds. No  stridor. No wheezing, rhonchi or rales.  Chest:     Chest wall: No tenderness.  Abdominal:     General: Abdomen is flat. Bowel sounds are normal. There is no distension.     Palpations: Abdomen is soft. There is no mass.     Tenderness: There is no abdominal tenderness. There is no right CVA tenderness, left CVA tenderness, guarding or rebound.     Hernia: No hernia is present.  Musculoskeletal:        General: No swelling, tenderness, deformity or signs of injury. Normal range of motion.     Cervical back: Normal range of motion. No rigidity or tenderness.     Right lower leg: No edema.     Left lower leg: No edema.  Lymphadenopathy:     Cervical: No cervical adenopathy.  Skin:    General: Skin is warm.     Capillary Refill: Capillary refill takes less than 2 seconds.     Coloration: Skin is not jaundiced or pale.     Findings: No bruising, erythema, lesion or rash.  Neurological:     Mental Status: He is alert and oriented to person, place, and time. Mental status is at baseline.     Cranial Nerves: No cranial nerve deficit.     Sensory: No sensory deficit.     Motor: No weakness.     Coordination: Coordination normal.     Gait: Gait normal.     Deep Tendon Reflexes: Reflexes normal.  Psychiatric:        Behavior: Behavior normal.        Thought Content: Thought content normal.        Judgment: Judgment normal.      Results for orders placed or performed in visit on 05/30/23  PSA  Result Value Ref Range   Prostate Specific Ag, Serum 0.6 0.0 - 4.0 ng/mL  TSH  Result Value Ref Range   TSH 3.920 0.450 - 4.500 uIU/mL  Lipid panel  Result Value Ref Range   Cholesterol, Total 168 100 - 199 mg/dL   Triglycerides 161 (H) 0 - 149 mg/dL   HDL 42 >09 mg/dL   VLDL Cholesterol Cal 29 5 - 40 mg/dL   LDL Chol Calc (NIH) 97 0 - 99 mg/dL    Chol/HDL Ratio 4.0 0.0 - 5.0 ratio  Comprehensive metabolic panel  Result Value Ref Range   Glucose 94 70 - 99 mg/dL   BUN 10 6 - 24 mg/dL   Creatinine, Ser 6.04 0.76 - 1.27 mg/dL   eGFR 540 >98 JX/BJY/7.82   BUN/Creatinine Ratio 11 9 - 20   Sodium 139 134 - 144 mmol/L   Potassium 5.1 3.5 - 5.2 mmol/L   Chloride 102 96 - 106 mmol/L   CO2 24 20 - 29 mmol/L   Calcium 9.2 8.7 - 10.2 mg/dL   Total Protein 7.0 6.0 - 8.5 g/dL   Albumin 4.3 4.1 - 5.1 g/dL   Globulin, Total 2.7 1.5 - 4.5 g/dL   Bilirubin Total 0.6 0.0 - 1.2 mg/dL   Alkaline Phosphatase 79 44 - 121 IU/L   AST 17 0 - 40 IU/L   ALT 21 0 - 44 IU/L  CBC  Result Value Ref Range   WBC 5.1 3.4 - 10.8 x10E3/uL   RBC 5.01 4.14 - 5.80 x10E6/uL   Hemoglobin 15.0 13.0 - 17.7 g/dL   Hematocrit 95.6 21.3 - 51.0 %   MCV 88 79 - 97 fL   MCH 29.9  26.6 - 33.0 pg   MCHC 33.9 31.5 - 35.7 g/dL   RDW 16.1 09.6 - 04.5 %   Platelets 258 150 - 450 x10E3/uL    Assessment & Plan    Routine Health Maintenance and Physical Exam  Exercise Activities and Dietary recommendations  Goals   None     Immunization History  Administered Date(s) Administered   Moderna Sars-Covid-2 Vaccination 01/19/2020, 02/16/2020   Tdap 10/19/2022    Health Maintenance  Topic Date Due   Colon Cancer Screening  Never done   COVID-19 Vaccine (3 - 2023-24 season) 05/26/2023   Flu Shot  12/23/2023*   DTaP/Tdap/Td vaccine (2 - Td or Tdap) 10/19/2032   Hepatitis C Screening  Completed   HIV Screening  Completed   HPV Vaccine  Aged Out  *Topic was postponed. The date shown is not the original due date.    Discussed health benefits of physical activity, and encouraged him to engage in regular exercise appropriate for his age and condition.    1. Annual physical exam UTD on eye exam but advised to proceed with dental exam. Things to do to keep yourself healthy  - Exercise at least 30-45 minutes a day, 3-4 days a week.  - Eat a low-fat diet with lots of  fruits and vegetables, up to 7-9 servings per day.  - Seatbelts can save your life. Wear them always.  - Smoke detectors on every level of your home, check batteries every year.  - Eye Doctor - have an eye exam every 1-2 years  - Safe sex - if you may be exposed to STDs, use a condom.  - Alcohol -  If you drink, do it moderately, less than 2 drinks per day.  - Health Care Power of Attorney. Choose someone to speak for you if you are not able.  - Depression is common in our stressful world.If you're feeling down or losing interest in things you normally enjoy, please come in for a visit.  - Violence - If anyone is threatening or hurting you, please call immediately.   Primary hypertension Chronic and unstable Blood pressure readings ranging from 130s to 140s systolic. Currently on Amlodipine, Losartan 25mg , and Metoprolol 25mg . Recent lifestyle changes with a new job that is less physically active. -Monitor blood pressure twice daily for two weeks. -Encourage increased physical activity. -Return for follow-up in two weeks to reassess blood pressure control.  Encounter for special screening examination for cardiovascular disorder - Lipid panel  Encounter for screening for hematologic disorder - CBC  Screening for metabolic disorder - Comprehensive metabolic panel  Prostate cancer screening - PSA  Screening for thyroid disorder - TSH  Diplopia History of double vision, managed with new prescription glasses with prism stickers. Last eye doctor visit in April of this year. -No changes to current management plan. Fu with ophthalmology  Seasonal Allergies Chronic issue, no changes in symptoms. -Start using nasal saline rinse.  General Health Maintenance -Order metabolic panel, PSA for prostate cancer screening, and urinalysis. -Encourage annual dental exam. -Declined flu vaccine.     Return in about 2 weeks (around 06/13/2023) for BP f/u.    The patient was advised to call  back or seek an in-person evaluation if the symptoms worsen or if the condition fails to improve as anticipated.  I discussed the assessment and treatment plan with the patient. The patient was provided an opportunity to ask questions and all were answered. The patient agreed with the plan and demonstrated  an understanding of the instructions.  I, Debera Lat, PA-C have reviewed all documentation for this visit. The documentation on  05/30/23 for the exam, diagnosis, procedures, and orders are all accurate and complete.  Debera Lat, [facility], MMS [facility] [phone] (phone) [phone] (fax)  [facility]

## 2023-05-30 ENCOUNTER — Ambulatory Visit (INDEPENDENT_AMBULATORY_CARE_PROVIDER_SITE_OTHER): Payer: Managed Care, Other (non HMO) | Admitting: Physician Assistant

## 2023-05-30 ENCOUNTER — Encounter: Payer: Self-pay | Admitting: Physician Assistant

## 2023-05-30 VITALS — BP 141/107 | HR 66 | Ht 69.0 in | Wt 163.9 lb

## 2023-05-30 DIAGNOSIS — Z130 Encounter for screening for diseases of the blood and blood-forming organs and certain disorders involving the immune mechanism: Secondary | ICD-10-CM | POA: Diagnosis not present

## 2023-05-30 DIAGNOSIS — Z0000 Encounter for general adult medical examination without abnormal findings: Secondary | ICD-10-CM | POA: Diagnosis not present

## 2023-05-30 DIAGNOSIS — Z125 Encounter for screening for malignant neoplasm of prostate: Secondary | ICD-10-CM

## 2023-05-30 DIAGNOSIS — Z136 Encounter for screening for cardiovascular disorders: Secondary | ICD-10-CM

## 2023-05-30 DIAGNOSIS — Z13228 Encounter for screening for other metabolic disorders: Secondary | ICD-10-CM

## 2023-05-30 DIAGNOSIS — Z1329 Encounter for screening for other suspected endocrine disorder: Secondary | ICD-10-CM

## 2023-05-30 DIAGNOSIS — I10 Essential (primary) hypertension: Secondary | ICD-10-CM | POA: Diagnosis not present

## 2023-05-31 DIAGNOSIS — I10 Essential (primary) hypertension: Secondary | ICD-10-CM | POA: Insufficient documentation

## 2023-05-31 LAB — LIPID PANEL
Chol/HDL Ratio: 4 ratio (ref 0.0–5.0)
Cholesterol, Total: 168 mg/dL (ref 100–199)
HDL: 42 mg/dL (ref >39)
LDL Chol Calc (NIH): 97 mg/dL (ref 0–99)
Triglycerides: 166 mg/dL — ABNORMAL HIGH (ref 0–149)
VLDL Cholesterol Cal: 29 mg/dL (ref 5–40)

## 2023-05-31 LAB — CBC
Hematocrit: 44.2 % (ref 37.5–51.0)
Hemoglobin: 15 g/dL (ref 13.0–17.7)
MCH: 29.9 pg (ref 26.6–33.0)
MCHC: 33.9 g/dL (ref 31.5–35.7)
MCV: 88 fL (ref 79–97)
Platelets: 258 10*3/uL (ref 150–450)
RBC: 5.01 x10E6/uL (ref 4.14–5.80)
RDW: 12.6 % (ref 11.6–15.4)
WBC: 5.1 10*3/uL (ref 3.4–10.8)

## 2023-05-31 LAB — COMPREHENSIVE METABOLIC PANEL
ALT: 21 IU/L (ref 0–44)
AST: 17 IU/L (ref 0–40)
Albumin: 4.3 g/dL (ref 4.1–5.1)
Alkaline Phosphatase: 79 IU/L (ref 44–121)
BUN/Creatinine Ratio: 11 (ref 9–20)
BUN: 10 mg/dL (ref 6–24)
Bilirubin Total: 0.6 mg/dL (ref 0.0–1.2)
CO2: 24 mmol/L (ref 20–29)
Calcium: 9.2 mg/dL (ref 8.7–10.2)
Chloride: 102 mmol/L (ref 96–106)
Creatinine, Ser: 0.89 mg/dL (ref 0.76–1.27)
Globulin, Total: 2.7 g/dL (ref 1.5–4.5)
Glucose: 94 mg/dL (ref 70–99)
Potassium: 5.1 mmol/L (ref 3.5–5.2)
Sodium: 139 mmol/L (ref 134–144)
Total Protein: 7 g/dL (ref 6.0–8.5)
eGFR: 105 mL/min/{1.73_m2} (ref >59)

## 2023-05-31 LAB — PSA: Prostate Specific Ag, Serum: 0.6 ng/mL (ref 0.0–4.0)

## 2023-05-31 LAB — TSH: TSH: 3.92 u[IU]/mL (ref 0.450–4.500)

## 2023-05-31 NOTE — Progress Notes (Signed)
All labs wnl except some elevation of TGL. Advised omega 3 OTC , low cholesterol diet and exercise

## 2023-06-13 ENCOUNTER — Encounter: Payer: Self-pay | Admitting: Physician Assistant

## 2023-06-13 ENCOUNTER — Ambulatory Visit: Payer: Managed Care, Other (non HMO) | Admitting: Physician Assistant

## 2023-06-13 VITALS — BP 132/95 | HR 61 | Ht 69.0 in | Wt 166.8 lb

## 2023-06-13 DIAGNOSIS — I10 Essential (primary) hypertension: Secondary | ICD-10-CM | POA: Diagnosis not present

## 2023-06-13 DIAGNOSIS — R5383 Other fatigue: Secondary | ICD-10-CM | POA: Diagnosis not present

## 2023-06-13 MED ORDER — AMLODIPINE BESYLATE 5 MG PO TABS: ORAL_TABLET | ORAL | 3 refills | Status: AC

## 2023-06-13 MED ORDER — LOSARTAN POTASSIUM 50 MG PO TABS
50.0000 mg | ORAL_TABLET | Freq: Every day | ORAL | 3 refills | Status: AC
Start: 2023-06-13 — End: ?

## 2023-06-13 MED ORDER — METOPROLOL SUCCINATE ER 25 MG PO TB24
25.0000 mg | ORAL_TABLET | Freq: Every day | ORAL | 3 refills | Status: AC
Start: 2023-06-13 — End: ?

## 2023-06-13 NOTE — Progress Notes (Signed)
Established patient visit  Patient: Bryan Coleman   DOB: December 06, 1975   47 y.o. Male  MRN: [id number] Visit Date: 06/13/2023  Today's healthcare provider: Debera Lat, PA-C   Chief Complaint  Patient presents with   Follow-up    Blood pressure   Subjective    HPI Hypertension, follow-up  BP Readings from Last 3 Encounters:  06/13/23 (!) 132/95  05/30/23 (!) 141/107  02/19/23 118/81   Wt Readings from Last 3 Encounters:  06/13/23 166 lb 12.8 oz (75.7 kg)  05/30/23 163 lb 14.4 oz (74.3 kg)  02/19/23 166 lb (75.3 kg)     He was last seen for hypertension 2 weeks ago.  BP at that visit was see above. Management since that visit includes amlodipine, metoprolol and cozaar. Was advised to measure BP at home. He reports good compliance with treatment. He is not having side effects.  He is following a Low Sodium diet. He is not exercising. He does not smoke.   Outside blood pressures are same as in the office Symptoms: No chest pain No chest pressure  No palpitations No syncope  No dyspnea No orthopnea  No paroxysmal nocturnal dyspnea No lower extremity edema   Pertinent labs Lab Results  Component Value Date   CHOL 168 05/30/2023   HDL 42 05/30/2023   LDLCALC 97 05/30/2023   TRIG 166 (H) 05/30/2023   CHOLHDL 4.0 05/30/2023   Lab Results  Component Value Date   NA 139 05/30/2023   K 5.1 05/30/2023   CREATININE 0.89 05/30/2023   EGFR 105 05/30/2023   GLUCOSE 94 05/30/2023   TSH 3.920 05/30/2023     The 10-year ASCVD risk score (Arnett DK, et al., 2019) is: 3.6%  ---------------------------------------------------------------------------------------------------           05/30/2023    8:29 AM 02/19/2023    1:44 PM 10/19/2022    8:11 AM  Depression screen PHQ 2/9  Decreased Interest 0 0 0  Down, Depressed, Hopeless 0 0 0  PHQ - 2 Score 0 0 0  Altered sleeping 1 0 0  Tired, decreased energy 0 0 0  Change in appetite 0 0 0  Feeling bad or failure about  yourself  0 0 0  Trouble concentrating 0 0 0  Moving slowly or fidgety/restless 0 0 0  Suicidal thoughts 0 0 0  PHQ-9 Score 1 0 0  Difficult doing work/chores  Not difficult at all Not difficult at all      05/30/2023    8:28 AM  GAD 7 : Generalized Anxiety Score  Nervous, Anxious, on Edge 0  Control/stop worrying 0  Worry too much - different things 0  Trouble relaxing 0  Restless 0  Easily annoyed or irritable 0  Afraid - awful might happen 0  Total GAD 7 Score 0    Medications: Outpatient Medications Prior to Visit  Medication Sig   [DISCONTINUED] amLODipine (NORVASC) 5 MG tablet Start  with 1/2 tab daily   [DISCONTINUED] losartan (COZAAR) 25 MG tablet Take 1 tablet (25 mg total) by mouth daily.   [DISCONTINUED] metoprolol succinate (TOPROL-XL) 25 MG 24 hr tablet Take 1 tablet (25 mg total) by mouth daily.   No facility-administered medications prior to visit.    Review of Systems  All other systems reviewed and are negative.  Except see HPI       Objective    BP (!) 132/95 (BP Location: Right Arm, Patient Position: Sitting, Cuff Size: Normal)   Pulse 61  Ht 5\' 9"  (1.753 m)   Wt 166 lb 12.8 oz (75.7 kg)   SpO2 100%   BMI 24.63 kg/m     Physical Exam Vitals reviewed.  Constitutional:      General: He is not in acute distress.    Appearance: Normal appearance. He is not diaphoretic.  HENT:     Head: Normocephalic and atraumatic.  Eyes:     General: No scleral icterus.    Conjunctiva/sclera: Conjunctivae normal.  Cardiovascular:     Rate and Rhythm: Normal rate and regular rhythm.     Pulses: Normal pulses.     Heart sounds: Normal heart sounds. No murmur heard. Pulmonary:     Effort: Pulmonary effort is normal. No respiratory distress.     Breath sounds: Normal breath sounds. No wheezing or rhonchi.  Musculoskeletal:     Cervical back: Neck supple.     Right lower leg: No edema.     Left lower leg: No edema.  Lymphadenopathy:     Cervical: No  cervical adenopathy.  Skin:    General: Skin is warm and dry.     Findings: No rash.  Neurological:     Mental Status: He is alert and oriented to person, place, and time. Mental status is at baseline.  Psychiatric:        Mood and Affect: Mood normal.        Behavior: Behavior normal.      No results found for any visits on 06/13/23.  Assessment & Plan    1. Essential hypertension Chronic and unstable  Increase a dose of amlodipine to 1 tablet and losartan to 50mg . - amLODipine (NORVASC) 5 MG tablet; Dispense: 90 tablet; Refill: 3 - metoprolol succinate (TOPROL-XL) 25 MG 24 hr tablet; Take 1 tablet (25 mg total) by mouth daily.  Dispense: 90 tablet; Refill: 3 - losartan (COZAAR) 50 MG tablet; Take 1 tablet (50 mg total) by mouth daily.  Dispense: 90 tablet; Refill: 3 Continue BP log and low salt diet with exercise  2. 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 If symptoms persist will check vit D, B 12..     Return in about 6 weeks (around 07/25/2023) for BP f/u.      The patient was advised to call back or seek an in-person evaluation if the symptoms worsen or if the condition fails to improve as anticipated.  I discussed the assessment and treatment plan with the patient. The patient was provided an opportunity to ask questions and all were answered. The patient agreed with the plan and demonstrated an understanding of the instructions.  I, Debera Lat, PA-C have reviewed all documentation for this visit. The documentation on 06/13/23  for the exam, diagnosis, procedures, and orders are all accurate and complete.  Debera Lat, [facility], MMS [facility] [phone] (phone) [phone] (fax)   [facility]

## 2023-07-28 NOTE — Progress Notes (Signed)
Established patient visit  Patient: Bryan Coleman   DOB: 1975/12/04   47 y.o. Male  MRN: [id number] Visit Date: 08/02/2023  Today's healthcare provider: Debera Lat, PA-C   Chief Complaint  Patient presents with   Hypertension   Subjective    HPI  Hypertension, follow-up  BP Readings from Last 3 Encounters:  08/02/23 118/85  06/13/23 (!) 132/95  05/30/23 (!) 141/107   Wt Readings from Last 3 Encounters:  08/02/23 167 lb 11.2 oz (76.1 kg)  06/13/23 166 lb 12.8 oz (75.7 kg)  05/30/23 163 lb 14.4 oz (74.3 kg)     He was last seen for hypertension 6 weeks ago.  BP at that visit was 132/95. Management since that visit includes triple therapy.  He reports good compliance with treatment. He is not having side effects.    Symptoms: No chest pain No chest pressure  No palpitations No syncope  No dyspnea No orthopnea  No paroxysmal nocturnal dyspnea No lower extremity edema   Pertinent labs Lab Results  Component Value Date   CHOL 168 05/30/2023   HDL 42 05/30/2023   LDLCALC 97 05/30/2023   TRIG 166 (H) 05/30/2023   CHOLHDL 4.0 05/30/2023   Lab Results  Component Value Date   NA 139 05/30/2023   K 5.1 05/30/2023   CREATININE 0.89 05/30/2023   EGFR 105 05/30/2023   GLUCOSE 94 05/30/2023   TSH 3.920 05/30/2023     The 10-year ASCVD risk score (Arnett DK, et al., 2019) is: 3%  ---------------------------------------------------------------------------------------------------              05/30/2023    8:29 AM 02/19/2023    1:44 PM 10/19/2022    8:11 AM  Depression screen PHQ 2/9  Decreased Interest 0 0 0  Down, Depressed, Hopeless 0 0 0  PHQ - 2 Score 0 0 0  Altered sleeping 1 0 0  Tired, decreased energy 0 0 0  Change in appetite 0 0 0  Feeling bad or failure about yourself  0 0 0  Trouble concentrating 0 0 0  Moving slowly or fidgety/restless 0 0 0  Suicidal thoughts 0 0 0  PHQ-9 Score 1 0 0  Difficult doing work/chores  Not difficult at all Not  difficult at all      05/30/2023    8:28 AM  GAD 7 : Generalized Anxiety Score  Nervous, Anxious, on Edge 0  Control/stop worrying 0  Worry too much - different things 0  Trouble relaxing 0  Restless 0  Easily annoyed or irritable 0  Afraid - awful might happen 0  Total GAD 7 Score 0    Medications: Outpatient Medications Prior to Visit  Medication Sig   amLODipine (NORVASC) 5 MG tablet Start  with 1/2 tab daily   losartan (COZAAR) 50 MG tablet Take 1 tablet (50 mg total) by mouth daily.   metoprolol succinate (TOPROL-XL) 25 MG 24 hr tablet Take 1 tablet (25 mg total) by mouth daily.   No facility-administered medications prior to visit.    Review of Systems  All other systems reviewed and are negative.  Except see HPI       Objective    BP 118/85 (BP Location: Right Arm, Patient Position: Sitting, Cuff Size: Normal)   Pulse 68   Temp 98.2 F (36.8 C) (Oral)   Ht 5\' 9"  (1.753 m)   Wt 167 lb 11.2 oz (76.1 kg)   SpO2 99%   BMI 24.76 kg/m  Physical Exam Vitals reviewed.  Constitutional:      General: He is not in acute distress.    Appearance: Normal appearance. He is not diaphoretic.  HENT:     Head: Normocephalic and atraumatic.  Eyes:     General: No scleral icterus.    Conjunctiva/sclera: Conjunctivae normal.  Cardiovascular:     Rate and Rhythm: Normal rate and regular rhythm.     Pulses: Normal pulses.     Heart sounds: Normal heart sounds. No murmur heard. Pulmonary:     Effort: Pulmonary effort is normal. No respiratory distress.     Breath sounds: Normal breath sounds. No wheezing or rhonchi.  Musculoskeletal:     Cervical back: Neck supple.     Right lower leg: No edema.     Left lower leg: No edema.  Lymphadenopathy:     Cervical: No cervical adenopathy.  Skin:    General: Skin is warm and dry.     Findings: No rash.  Neurological:     Mental Status: He is alert and oriented to person, place, and time. Mental status is at baseline.   Psychiatric:        Mood and Affect: Mood normal.        Behavior: Behavior normal.      No results found for any visits on 08/02/23.  Assessment & Plan      Hypertension Chronic and stable BP today WNL Currently managed on Norvasc 5mg , Losartan 50mg , and Metoprolol 25mg . -Encouraged low salt diet and increased water intake. -Follow-up in 3 months.   Return in about 3 months (around 11/02/2023) for chronic disease f/u, BP f/u.     The patient was advised to call back or seek an in-person evaluation if the symptoms worsen or if the condition fails to improve as anticipated.  I discussed the assessment and treatment plan with the patient. The patient was provided an opportunity to ask questions and all were answered. The patient agreed with the plan and demonstrated an understanding of the instructions.  I, Debera Lat, PA-C have reviewed all documentation for this visit. The documentation on  08/02/23 for the exam, diagnosis, procedures, and orders are all accurate and complete.  Debera Lat, [facility], MMS [facility] [phone] (phone) [phone] (fax)  [facility]

## 2023-07-31 ENCOUNTER — Ambulatory Visit: Payer: Managed Care, Other (non HMO) | Admitting: Physician Assistant

## 2023-08-02 ENCOUNTER — Ambulatory Visit: Payer: Managed Care, Other (non HMO) | Admitting: Physician Assistant

## 2023-08-02 VITALS — BP 118/85 | HR 68 | Temp 98.2°F | Ht 69.0 in | Wt 167.7 lb

## 2023-08-02 DIAGNOSIS — I10 Essential (primary) hypertension: Secondary | ICD-10-CM

## 2023-08-03 ENCOUNTER — Encounter: Payer: Self-pay | Admitting: Physician Assistant

## 2023-11-01 ENCOUNTER — Ambulatory Visit: Payer: Managed Care, Other (non HMO) | Admitting: Physician Assistant

## 2023-11-01 ENCOUNTER — Encounter: Payer: Self-pay | Admitting: Physician Assistant

## 2023-11-01 VITALS — BP 121/81 | HR 75 | Ht 69.0 in | Wt 172.5 lb

## 2023-11-01 DIAGNOSIS — I10 Essential (primary) hypertension: Secondary | ICD-10-CM | POA: Diagnosis not present

## 2023-11-01 NOTE — Progress Notes (Addendum)
 Established patient visit  Patient: Bryan Coleman   DOB: 01/29/1976   48 y.o. Male  MRN: [id number] Visit Date: 11/01/2023  Today's healthcare provider: Jolynn Spencer, PA-C   Chief Complaint  Patient presents with   Medical Management of Chronic Issues    3 month follow-up   Hypertension   Subjective      Discussed the use of AI scribe software for clinical note transcription with the patient, who gave verbal consent to proceed.  History of Present Illness   The patient, with a history of hypertension and depression, presents for a routine check-up. He reports feeling more relaxed and energetic since changing jobs, despite initial expectations of increased pressure. He is currently on amlodipine , losartan , and metoprolol  for blood pressure management, taking two in the morning and one at night. At home, his blood pressure readings are around 120s/80s.   The patient also mentions a vision issue, for which he is wearing corrective glasses. Without the glasses, he sees double. He has been advised that this is a permanent condition and he will need to wear the glasses constantly. He has researched other corrective options, but has decided to stick with the glasses.  The patient also mentions a weight gain, from 163 to 172 pounds over the past year. He acknowledges the need to incorporate more exercise into his routine and improve his diet. He does not report any issues with bowel movements or urination.           11/01/2023    3:56 PM 05/30/2023    8:29 AM 02/19/2023    1:44 PM  Depression screen PHQ 2/9  Decreased Interest 0 0 0  Down, Depressed, Hopeless 0 0 0  PHQ - 2 Score 0 0 0  Altered sleeping 0 1 0  Tired, decreased energy 0 0 0  Change in appetite 0 0 0  Feeling bad or failure about yourself  0 0 0  Trouble concentrating 0 0 0  Moving slowly or fidgety/restless 0 0 0  Suicidal thoughts 0 0 0  PHQ-9 Score 0 1 0  Difficult doing work/chores   Not difficult at all       11/01/2023    3:56 PM 05/30/2023    8:28 AM  GAD 7 : Generalized Anxiety Score  Nervous, Anxious, on Edge 0 0  Control/stop worrying 0 0  Worry too much - different things 0 0  Trouble relaxing 0 0  Restless 0 0  Easily annoyed or irritable 0 0  Afraid - awful might happen 0 0  Total GAD 7 Score 0 0    Medications: Outpatient Medications Prior to Visit  Medication Sig   amLODipine  (NORVASC ) 5 MG tablet Start  with 1/2 tab daily   losartan  (COZAAR ) 50 MG tablet Take 1 tablet (50 mg total) by mouth daily.   metoprolol  succinate (TOPROL -XL) 25 MG 24 hr tablet Take 1 tablet (25 mg total) by mouth daily.   No facility-administered medications prior to visit.    Review of Systems  All other systems reviewed and are negative.  All negative Except see HPI       Objective    BP 121/81   Pulse 75   Ht 5' 9 (1.753 m)   Wt 172 lb 8 oz (78.2 kg)   SpO2 96%   BMI 25.47 kg/m     Physical Exam Vitals reviewed.  Constitutional:      General: He is not in acute distress.    Appearance:  Normal appearance. He is not diaphoretic.  HENT:     Head: Normocephalic and atraumatic.  Eyes:     General: No scleral icterus.    Conjunctiva/sclera: Conjunctivae normal.  Cardiovascular:     Rate and Rhythm: Normal rate and regular rhythm.     Pulses: Normal pulses.     Heart sounds: Normal heart sounds. No murmur heard. Pulmonary:     Effort: Pulmonary effort is normal. No respiratory distress.     Breath sounds: Normal breath sounds. No wheezing or rhonchi.  Musculoskeletal:     Cervical back: Neck supple.     Right lower leg: No edema.     Left lower leg: No edema.  Lymphadenopathy:     Cervical: No cervical adenopathy.  Skin:    General: Skin is warm and dry.     Findings: No rash.  Neurological:     Mental Status: He is alert and oriented to person, place, and time. Mental status is at baseline.  Psychiatric:        Mood and Affect: Mood normal.        Behavior: Behavior  normal.      No results found for any visits on 11/01/23.      Assessment and Plan    Hypertension  chronic  Blood pressure well controlled at home with readings in the mid 120s/80s. Currently on Amlodipine , Losartan , and Metoprolol .   -Continue current medications.  Weight Gain   Noted increase in weight from 163 to 172 since last year.   -Encouraged to increase physical activity and improve diet.  Diplopia   He reports improvement with corrective glasses, but still experiences double vision without them.   -Continue use of corrective glasses.  Follow-up in 4 months. If any issues arise, he is to contact the office.      No orders of the defined types were placed in this encounter.   Return in about 4 months (around 02/29/2024) for BP f/u, chronic disease f/u.   The patient was advised to call back or seek an in-person evaluation if the symptoms worsen or if the condition fails to improve as anticipated.  I discussed the assessment and treatment plan with the patient. The patient was provided an opportunity to ask questions and all were answered. The patient agreed with the plan and demonstrated an understanding of the instructions.  I, Emmory Solivan, PA-C have reviewed all documentation for this visit. The documentation on 11/01/2023  for the exam, diagnosis, procedures, and orders are all accurate and complete.  Jolynn Spencer, [facility], MMS [facility] [phone] (phone) [phone] (fax)  [facility]

## 2024-02-27 NOTE — Progress Notes (Signed)
 Established patient visit  Patient: Bryan Coleman   DOB: 02/13/1976   48 y.o. Male  MRN: [id number] Visit Date: 02/28/2024  Today's healthcare provider: Blane Bunting, PA-C   No chief complaint on file.  Subjective       Discussed the use of AI scribe software for clinical note transcription with the patient, who gave verbal consent to proceed.  History of Present Illness        11/01/2023    3:56 PM 05/30/2023    8:29 AM 02/19/2023    1:44 PM  Depression screen PHQ 2/9  Decreased Interest 0 0 0  Down, Depressed, Hopeless 0 0 0  PHQ - 2 Score 0 0 0  Altered sleeping 0 1 0  Tired, decreased energy 0 0 0  Change in appetite 0 0 0  Feeling bad or failure about yourself  0 0 0  Trouble concentrating 0 0 0  Moving slowly or fidgety/restless 0 0 0  Suicidal thoughts 0 0 0  PHQ-9 Score 0 1 0  Difficult doing work/chores   Not difficult at all      11/01/2023    3:56 PM 05/30/2023    8:28 AM  GAD 7 : Generalized Anxiety Score  Nervous, Anxious, on Edge 0 0  Control/stop worrying 0 0  Worry too much - different things 0 0  Trouble relaxing 0 0  Restless 0 0  Easily annoyed or irritable 0 0  Afraid - awful might happen 0 0  Total GAD 7 Score 0 0    Medications: Outpatient Medications Prior to Visit  Medication Sig  . amLODipine  (NORVASC ) 5 MG tablet Start  with 1/2 tab daily  . losartan  (COZAAR ) 50 MG tablet Take 1 tablet (50 mg total) by mouth daily.  . metoprolol  succinate (TOPROL -XL) 25 MG 24 hr tablet Take 1 tablet (25 mg total) by mouth daily.   No facility-administered medications prior to visit.    Review of Systems  All other systems reviewed and are negative. All negative Except see HPI   {Insert previous labs (optional):23779} {See past labs  Heme  Chem  Endocrine  Serology  Results Review (optional):1}   Objective    There were no vitals taken for this visit. {Insert last BP/Wt (optional):23777}{See vitals history (optional):1}   Physical  Exam Vitals reviewed.  Constitutional:      General: He is not in acute distress.    Appearance: Normal appearance. He is not diaphoretic.  HENT:     Head: Normocephalic and atraumatic.  Eyes:     General: No scleral icterus.    Conjunctiva/sclera: Conjunctivae normal.  Cardiovascular:     Rate and Rhythm: Normal rate and regular rhythm.     Pulses: Normal pulses.     Heart sounds: Normal heart sounds. No murmur heard. Pulmonary:     Effort: Pulmonary effort is normal. No respiratory distress.     Breath sounds: Normal breath sounds. No wheezing or rhonchi.  Musculoskeletal:     Cervical back: Neck supple.     Right lower leg: No edema.     Left lower leg: No edema.  Lymphadenopathy:     Cervical: No cervical adenopathy.  Skin:    General: Skin is warm and dry.     Findings: No rash.  Neurological:     Mental Status: He is alert and oriented to person, place, and time. Mental status is at baseline.  Psychiatric:        Mood and Affect:  Mood normal.        Behavior: Behavior normal.     No results found for any visits on 02/28/24.      Assessment and Plan Assessment & Plan     No orders of the defined types were placed in this encounter.   No follow-ups on file.   The patient was advised to call back or seek an in-person evaluation if the symptoms worsen or if the condition fails to improve as anticipated.  I discussed the assessment and treatment plan with the patient. The patient was provided an opportunity to ask questions and all were answered. The patient agreed with the plan and demonstrated an understanding of the instructions.  I, Jawanna Dykman, PA-C have reviewed all documentation for this visit. The documentation on 02/28/2024  for the exam, diagnosis, procedures, and orders are all accurate and complete.  Blane Bunting, [facility], MMS [facility] [phone] (phone) [phone] (fax)  [facility]

## 2024-02-28 ENCOUNTER — Ambulatory Visit: Payer: Managed Care, Other (non HMO) | Admitting: Physician Assistant

## 2024-02-28 ENCOUNTER — Encounter: Payer: Self-pay | Admitting: Physician Assistant

## 2024-02-28 VITALS — BP 123/82 | HR 66 | Resp 16 | Ht 69.0 in | Wt 171.0 lb

## 2024-02-28 DIAGNOSIS — E7889 Other lipoprotein metabolism disorders: Secondary | ICD-10-CM | POA: Diagnosis not present

## 2024-02-28 DIAGNOSIS — H532 Diplopia: Secondary | ICD-10-CM | POA: Diagnosis not present

## 2024-02-28 DIAGNOSIS — I10 Essential (primary) hypertension: Secondary | ICD-10-CM | POA: Diagnosis not present

## 2024-02-28 DIAGNOSIS — R635 Abnormal weight gain: Secondary | ICD-10-CM

## 2024-05-31 NOTE — Progress Notes (Unsigned)
 Complete physical exam  Patient: Bryan Coleman   DOB: [birth year]   48 y.o. Male  MRN: [id number] Visit Date: 06/01/2024  Today's healthcare provider: Jolynn Spencer, PA-C   No chief complaint on file.  Subjective    Bryan Coleman is a 48 y.o. male who presents today for a complete physical exam.  He reports consuming a {diet types:17450} diet. {Exercise:19826} He generally feels {well/fairly well/poorly:18703}. He reports sleeping {well/fairly well/poorly:18703}. He {does/does not:200015} have additional problems to discuss today.  HPI  *** Discussed the use of AI scribe software for clinical note transcription with the patient, who gave verbal consent to proceed.  History of Present Illness   except some elevation of TGL. Advised omega 3 OTC , low cholesterol diet and exercise a year ago  Last depression screening scores    02/28/2024    4:05 PM 11/01/2023    3:56 PM 05/30/2023    8:29 AM  PHQ 2/9 Scores  PHQ - 2 Score 0 0 0  PHQ- 9 Score 0 0 1   Last fall risk screening    02/19/2023    1:44 PM  Fall Risk   Falls in the past year? 0  Number falls in past yr: 0  Injury with Fall? 0   Last Audit-C alcohol use screening    02/19/2023    1:44 PM  Alcohol Use Disorder Test (AUDIT)  1. How often do you have a drink containing alcohol? 0   A score of 3 or more in women, and 4 or more in men indicates increased risk for alcohol abuse, EXCEPT if all of the points are from question 1   Past Medical History:  Diagnosis Date   Hypertension    WPW (Wolff-Parkinson-White syndrome)    Past Surgical History:  Procedure Laterality Date   COLONOSCOPY  03-10-12   Dr Ora   Social History   Socioeconomic History   Marital status: Single    Spouse name: Not on file   Number of children: Not on file   Years of education: Not on file   Highest education level: Not on file  Occupational History   Not on file  Tobacco Use   Smoking status: Never   Smokeless tobacco:  Never  Vaping Use   Vaping status: Never Used  Substance and Sexual Activity   Alcohol use: No   Drug use: No   Sexual activity: Not on file  Other Topics Concern   Not on file  Social History Narrative   Not on file   Social Drivers of Health   Financial Resource Strain: Not on file  Food Insecurity: Not on file  Transportation Needs: Not on file  Physical Activity: Not on file  Stress: Not on file  Social Connections: Not on file  Intimate Partner Violence: Not on file   Family Status  Relation Name Status   Mother  Alive   Father  Alive   MGM  Deceased   MGF  Deceased   PGM  Deceased   PGF  Deceased   Neg Hx  (Not Specified)  No partnership data on file   Family History  Problem Relation Age of Onset   Healthy Mother    Anemia Father    Colon cancer Neg Hx    Prostate cancer Neg Hx    Allergies  Allergen Reactions   Camphor-Phenol Rash    blister   Caramel Rash   Chocolate Flavoring Agent (Non-Screening) Rash  Dextromethorphan-Guaifenesin Rash   Robitussin (Alcohol Free) [Guaifenesin] Rash   Tape Rash    blister    Patient Care Team: Lorenz Donley, PA-C as PCP - General (Physician Assistant) Dellie Louanne MATSU, MD (General Surgery) Albina GORMAN Dine, MD as Referring Physician (Internal Medicine)   Medications: Outpatient Medications Prior to Visit  Medication Sig   amLODipine  (NORVASC ) 5 MG tablet Start  with 1/2 tab daily   losartan  (COZAAR ) 50 MG tablet Take 1 tablet (50 mg total) by mouth daily.   metoprolol  succinate (TOPROL -XL) 25 MG 24 hr tablet Take 1 tablet (25 mg total) by mouth daily.   No facility-administered medications prior to visit.    Review of Systems  All other systems reviewed and are negative.  Except see HPI  {Insert previous labs (optional):23779} {See past labs  Heme  Chem  Endocrine  Serology  Results Review (optional):1}  Objective    There were no vitals taken for this visit. {Insert last BP/Wt  (optional):23777}{See vitals history (optional):1}    Physical Exam Vitals reviewed.  Constitutional:      General: He is not in acute distress.    Appearance: Normal appearance. He is well-developed. He is not ill-appearing, toxic-appearing or diaphoretic.  HENT:     Head: Normocephalic and atraumatic.     Right Ear: Tympanic membrane, ear canal and external ear normal.     Left Ear: Tympanic membrane, ear canal and external ear normal.     Nose: Nose normal. No congestion or rhinorrhea.     Mouth/Throat:     Mouth: Mucous membranes are moist.     Pharynx: Oropharynx is clear. No oropharyngeal exudate.  Eyes:     General: No scleral icterus.       Right eye: No discharge.        Left eye: No discharge.     Conjunctiva/sclera: Conjunctivae normal.     Pupils: Pupils are equal, round, and reactive to light.  Neck:     Thyroid: No thyromegaly.     Vascular: No carotid bruit.  Cardiovascular:     Rate and Rhythm: Normal rate and regular rhythm.     Pulses: Normal pulses.     Heart sounds: Normal heart sounds. No murmur heard.    No friction rub. No gallop.  Pulmonary:     Effort: Pulmonary effort is normal. No respiratory distress.     Breath sounds: Normal breath sounds. No wheezing, rhonchi or rales.  Abdominal:     General: Abdomen is flat. Bowel sounds are normal. There is no distension.     Palpations: Abdomen is soft. There is no mass.     Tenderness: There is no abdominal tenderness. There is no right CVA tenderness, left CVA tenderness, guarding or rebound.     Hernia: No hernia is present.  Musculoskeletal:        General: No swelling, tenderness, deformity or signs of injury. Normal range of motion.     Cervical back: Normal range of motion and neck supple. No rigidity or tenderness.     Right lower leg: No edema.     Left lower leg: No edema.  Lymphadenopathy:     Cervical: No cervical adenopathy.  Skin:    General: Skin is warm and dry.     Coloration: Skin is  not jaundiced or pale.     Findings: No bruising, erythema, lesion or rash.  Neurological:     Mental Status: He is alert and oriented to person, place, and  time. Mental status is at baseline.     Gait: Gait normal.  Psychiatric:        Mood and Affect: Mood normal.        Behavior: Behavior normal.        Thought Content: Thought content normal.        Judgment: Judgment normal.      No results found for any visits on 06/01/24.  Assessment & Plan    Routine Health Maintenance and Physical Exam  Exercise Activities and Dietary recommendations  Goals   None     Immunization History  Administered Date(s) Administered   Moderna Sars-Covid-2 Vaccination 01/19/2020, 02/16/2020   Tdap 10/19/2022    Health Maintenance  Topic Date Due   Hepatitis B Vaccine (1 of 3 - 19+ 3-dose series) Never done   Colon Cancer Screening  Never done   Pneumococcal Vaccine for age over 41 (1 of 1 - PCV) Never done   Zoster (Shingles) Vaccine (1 of 2) Never done   Flu Shot  Never done   COVID-19 Vaccine (3 - 2025-26 season) 05/25/2024   DTaP/Tdap/Td vaccine (2 - Td or Tdap) 10/19/2032   Hepatitis C Screening  Completed   HIV Screening  Completed   HPV Vaccine  Aged Out   Meningitis B Vaccine  Aged Out    Discussed health benefits of physical activity, and encouraged him to engage in regular exercise appropriate for his age and condition.  Assessment and Plan Assessment & Plan      ***  No follow-ups on file.    The patient was advised to call back or seek an in-person evaluation if the symptoms worsen or if the condition fails to improve as anticipated.  I discussed the assessment and treatment plan with the patient. The patient was provided an opportunity to ask questions and all were answered. The patient agreed with the plan and demonstrated an understanding of the instructions.  I, Lavel Rieman, PA-C have reviewed all documentation for this visit. The documentation on 06/01/2024   for the exam, diagnosis, procedures, and orders are all accurate and complete.  Jolynn Spencer, [facility], MMS [facility] [phone] (phone) [phone] (fax)  [facility]

## 2024-06-01 ENCOUNTER — Encounter: Payer: Self-pay | Admitting: Physician Assistant

## 2024-06-01 ENCOUNTER — Ambulatory Visit (INDEPENDENT_AMBULATORY_CARE_PROVIDER_SITE_OTHER): Admitting: Physician Assistant

## 2024-06-01 VITALS — BP 132/87 | HR 85 | Resp 16 | Ht 69.0 in | Wt 176.2 lb

## 2024-06-01 DIAGNOSIS — E7889 Other lipoprotein metabolism disorders: Secondary | ICD-10-CM | POA: Diagnosis not present

## 2024-06-01 DIAGNOSIS — H532 Diplopia: Secondary | ICD-10-CM | POA: Diagnosis not present

## 2024-06-01 DIAGNOSIS — Z0000 Encounter for general adult medical examination without abnormal findings: Secondary | ICD-10-CM

## 2024-06-01 DIAGNOSIS — I10 Essential (primary) hypertension: Secondary | ICD-10-CM | POA: Diagnosis not present

## 2024-06-02 ENCOUNTER — Ambulatory Visit: Payer: Self-pay | Admitting: Physician Assistant

## 2024-06-02 LAB — LIPID PANEL
Chol/HDL Ratio: 4.8 ratio (ref 0.0–5.0)
Cholesterol, Total: 176 mg/dL (ref 100–199)
HDL: 37 mg/dL — ABNORMAL LOW (ref >39)
LDL Chol Calc (NIH): 97 mg/dL (ref 0–99)
Triglycerides: 247 mg/dL — ABNORMAL HIGH (ref 0–149)
VLDL Cholesterol Cal: 42 mg/dL — ABNORMAL HIGH (ref 5–40)

## 2024-06-02 LAB — COMPREHENSIVE METABOLIC PANEL WITH GFR
ALT: 21 IU/L (ref 0–44)
AST: 16 IU/L (ref 0–40)
Albumin: 4.4 g/dL (ref 4.1–5.1)
Alkaline Phosphatase: 81 IU/L (ref 44–121)
BUN/Creatinine Ratio: 13 (ref 9–20)
BUN: 11 mg/dL (ref 6–24)
Bilirubin Total: 0.6 mg/dL (ref 0.0–1.2)
CO2: 22 mmol/L (ref 20–29)
Calcium: 9.2 mg/dL (ref 8.7–10.2)
Chloride: 101 mmol/L (ref 96–106)
Creatinine, Ser: 0.84 mg/dL (ref 0.76–1.27)
Globulin, Total: 2.6 g/dL (ref 1.5–4.5)
Glucose: 92 mg/dL (ref 70–99)
Potassium: 4.6 mmol/L (ref 3.5–5.2)
Sodium: 137 mmol/L (ref 134–144)
Total Protein: 7 g/dL (ref 6.0–8.5)
eGFR: 106 mL/min/1.73 (ref >59)

## 2024-06-02 LAB — TSH: TSH: 4.26 u[IU]/mL (ref 0.450–4.500)

## 2024-08-31 ENCOUNTER — Ambulatory Visit: Admitting: Physician Assistant

## 2024-08-31 ENCOUNTER — Encounter: Payer: Self-pay | Admitting: Physician Assistant

## 2024-08-31 VITALS — BP 133/100 | HR 68 | Resp 14 | Ht 69.0 in | Wt 173.3 lb

## 2024-08-31 DIAGNOSIS — I10 Essential (primary) hypertension: Secondary | ICD-10-CM | POA: Diagnosis not present

## 2024-08-31 DIAGNOSIS — E781 Pure hyperglyceridemia: Secondary | ICD-10-CM | POA: Diagnosis not present

## 2024-08-31 NOTE — Progress Notes (Signed)
 Established patient visit  Patient: Bryan Coleman   DOB: 28-Feb-1976   48 y.o. Male  MRN: [id number] Visit Date: 08/31/2024  Today's healthcare provider: Jolynn Spencer, PA-C   Chief Complaint  Patient presents with   Medical Management of Chronic Issues    2 month f/u   Subjective     HPI     Medical Management of Chronic Issues    Additional comments: 2 month f/u      Last edited by Wilfred Hargis RAMAN, CMA on 08/31/2024  8:26 AM.       Discussed the use of AI scribe software for clinical note transcription with the patient, who gave verbal consent to proceed.  History of Present Illness Bryan Coleman is a 48 year old male with hypertension who presents for follow-up of blood pressure management.  He has been checking his blood pressure at home with readings between 110/60 and 143/80 mmHg and is concerned about this variability. He takes amlodipine  5 mg, losartan  50 mg, and metoprolol  25 mg as prescribed and reports good adherence. He has no vision changes, leg swelling, or mood symptoms. He exercises twice daily, avoids added salt, and drinks sweet tea at night but denies other caffeine .  His last lipid panel in September showed elevated triglycerides. He is not on lipid-lowering medication and takes evening omega-3 supplements, sometimes without food. His kidney function was normal on last labs with a GFR of 106. He has not had palpitations.       08/31/2024    8:30 AM 02/28/2024    4:05 PM 11/01/2023    3:56 PM  Depression screen PHQ 2/9  Decreased Interest 0 0 0  Down, Depressed, Hopeless 0 0 0  PHQ - 2 Score 0 0 0  Altered sleeping  0 0  Tired, decreased energy  0 0  Change in appetite  0 0  Feeling bad or failure about yourself   0 0  Trouble concentrating  0 0  Moving slowly or fidgety/restless  0 0  Suicidal thoughts  0 0  PHQ-9 Score  0  0   Difficult doing work/chores  Not difficult at all      Data saved with a previous flowsheet row definition       02/28/2024    4:05 PM 11/01/2023    3:56 PM 05/30/2023    8:28 AM  GAD 7 : Generalized Anxiety Score  Nervous, Anxious, on Edge 0 0 0  Control/stop worrying 0 0 0  Worry too much - different things 0 0 0  Trouble relaxing 0 0 0  Restless 0 0 0  Easily annoyed or irritable 0 0 0  Afraid - awful might happen 0 0 0  Total GAD 7 Score 0 0 0  Anxiety Difficulty Not difficult at all      Medications: Outpatient Medications Prior to Visit  Medication Sig   amLODipine  (NORVASC ) 5 MG tablet Start  with 1/2 tab daily   losartan  (COZAAR ) 50 MG tablet Take 1 tablet (50 mg total) by mouth daily.   metoprolol  succinate (TOPROL -XL) 25 MG 24 hr tablet Take 1 tablet (25 mg total) by mouth daily.   No facility-administered medications prior to visit.    Review of Systems All negative Except see HPI       Objective    BP (!) 133/100   Pulse 68   Resp 14   Ht 5' 9 (1.753 m)   Wt 173 lb 4.8 oz (78.6 kg)  SpO2 100%   BMI 25.59 kg/m     Physical Exam Vitals reviewed.  Constitutional:      General: He is not in acute distress.    Appearance: Normal appearance. He is not diaphoretic.  HENT:     Head: Normocephalic and atraumatic.  Eyes:     General: No scleral icterus.    Conjunctiva/sclera: Conjunctivae normal.  Cardiovascular:     Rate and Rhythm: Normal rate and regular rhythm.     Pulses: Normal pulses.     Heart sounds: Normal heart sounds. No murmur heard. Pulmonary:     Effort: Pulmonary effort is normal. No respiratory distress.     Breath sounds: Normal breath sounds. No wheezing or rhonchi.  Musculoskeletal:     Cervical back: Neck supple.     Right lower leg: No edema.     Left lower leg: No edema.  Lymphadenopathy:     Cervical: No cervical adenopathy.  Skin:    General: Skin is warm and dry.     Findings: No rash.  Neurological:     Mental Status: He is alert and oriented to person, place, and time. Mental status is at baseline.  Psychiatric:        Mood and  Affect: Mood normal.        Behavior: Behavior normal.      No results found for any visits on 08/31/24.      Assessment and Plan Assessment & Plan Hypertension Chronic and unstable Blood pressure generally well-controlled with occasional elevations. Current medications: amlodipine , losartan , metoprolol . Office reading 133/100 mmHg. Normal kidney function and electrolytes. TSH near lower limit of normal without symptoms. Wrist cuff may affect home reading accuracy. - Bring blood pressure device to next appointment for comparison with office readings. - Consider morning dosing of one blood pressure medication to assess impact. - Monitor home blood pressure, report if consistently elevated. - Follow-up in 6-7 weeks or sooner if blood pressure remains elevated. Continue low sodium intake and regular exercise Will follow-up  Hypertriglyceridemia Chronic and stable Previously elevated triglycerides. Currently on omega-3 supplements, no lipid-lowering medication. Discussed distributing omega-3 intake for better coverage. Will recheck labs at the follow-up    No orders of the defined types were placed in this encounter.   Return in about 2 months (around 11/01/2024).   The patient was advised to call back or seek an in-person evaluation if the symptoms worsen or if the condition fails to improve as anticipated.  I discussed the assessment and treatment plan with the patient. The patient was provided an opportunity to ask questions and all were answered. The patient agreed with the plan and demonstrated an understanding of the instructions.  I, Damonica Chopra, PA-C have reviewed all documentation for this visit. The documentation on 08/31/2024  for the exam, diagnosis, procedures, and orders are all accurate and complete.  Jolynn Spencer, [facility], MMS [facility] [phone] (phone) [phone] (fax)  [facility]

## 2024-11-02 ENCOUNTER — Ambulatory Visit: Admitting: Physician Assistant

## 2024-11-02 DIAGNOSIS — H532 Diplopia: Secondary | ICD-10-CM

## 2024-11-02 DIAGNOSIS — Z23 Encounter for immunization: Secondary | ICD-10-CM

## 2024-11-02 DIAGNOSIS — N138 Other obstructive and reflux uropathy: Secondary | ICD-10-CM

## 2024-11-02 DIAGNOSIS — Z1211 Encounter for screening for malignant neoplasm of colon: Secondary | ICD-10-CM

## 2024-11-02 DIAGNOSIS — I10 Essential (primary) hypertension: Secondary | ICD-10-CM

## 2024-11-02 DIAGNOSIS — I456 Pre-excitation syndrome: Secondary | ICD-10-CM

## 2024-11-02 DIAGNOSIS — Z125 Encounter for screening for malignant neoplasm of prostate: Secondary | ICD-10-CM

## 2025-06-07 ENCOUNTER — Encounter: Admitting: Physician Assistant
